# Patient Record
Sex: Female | Born: 1945 | Race: White | Hispanic: No | Marital: Married | Smoking: Never smoker
Health system: Southern US, Community
[De-identification: ages and names within clinical notes are randomized; demographics above are authoritative.]

## PROBLEM LIST (undated history)

## (undated) DIAGNOSIS — E7800 Pure hypercholesterolemia, unspecified: Secondary | ICD-10-CM

## (undated) DIAGNOSIS — T8859XA Other complications of anesthesia, initial encounter: Secondary | ICD-10-CM

## (undated) DIAGNOSIS — K5790 Diverticulosis of intestine, part unspecified, without perforation or abscess without bleeding: Secondary | ICD-10-CM

## (undated) DIAGNOSIS — R112 Nausea with vomiting, unspecified: Secondary | ICD-10-CM

## (undated) DIAGNOSIS — Z9889 Other specified postprocedural states: Secondary | ICD-10-CM

## (undated) DIAGNOSIS — K8020 Calculus of gallbladder without cholecystitis without obstruction: Secondary | ICD-10-CM

## (undated) DIAGNOSIS — R011 Cardiac murmur, unspecified: Secondary | ICD-10-CM

## (undated) DIAGNOSIS — G4730 Sleep apnea, unspecified: Secondary | ICD-10-CM

## (undated) DIAGNOSIS — I10 Essential (primary) hypertension: Secondary | ICD-10-CM

## (undated) DIAGNOSIS — F419 Anxiety disorder, unspecified: Secondary | ICD-10-CM

## (undated) HISTORY — DX: Anxiety disorder, unspecified: F41.9

## (undated) HISTORY — DX: Diverticulosis of intestine, part unspecified, without perforation or abscess without bleeding: K57.90

## (undated) HISTORY — PX: ORIF ANKLE FRACTURE: SHX5408

## (undated) HISTORY — PX: KNEE CARTILAGE SURGERY: SHX688

## (undated) HISTORY — DX: Calculus of gallbladder without cholecystitis without obstruction: K80.20

## (undated) HISTORY — PX: ABDOMINAL HYSTERECTOMY: SHX81

## (undated) HISTORY — PX: COLONOSCOPY: SHX174

## (undated) HISTORY — DX: Cardiac murmur, unspecified: R01.1

---

## 1983-03-21 HISTORY — PX: ABDOMINAL HYSTERECTOMY: SHX81

## 2004-05-09 ENCOUNTER — Encounter: Admission: RE | Admit: 2004-05-09 | Discharge: 2004-05-09 | Payer: Self-pay | Admitting: Neurology

## 2005-03-20 HISTORY — PX: ORIF ANKLE FRACTURE: SHX5408

## 2012-06-14 ENCOUNTER — Other Ambulatory Visit (HOSPITAL_COMMUNITY): Payer: Self-pay | Admitting: Internal Medicine

## 2012-06-14 ENCOUNTER — Ambulatory Visit (HOSPITAL_COMMUNITY)
Admission: RE | Admit: 2012-06-14 | Discharge: 2012-06-14 | Disposition: A | Discharge status: Home / Self Care | Payer: BC Managed Care – PPO | Source: Ambulatory Visit | Attending: Internal Medicine | Admitting: Internal Medicine

## 2012-06-14 DIAGNOSIS — R51 Headache: Secondary | ICD-10-CM | POA: Insufficient documentation

## 2012-06-14 DIAGNOSIS — G8929 Other chronic pain: Secondary | ICD-10-CM

## 2021-06-20 ENCOUNTER — Encounter (HOSPITAL_COMMUNITY): Payer: Self-pay | Admitting: Emergency Medicine

## 2021-06-20 ENCOUNTER — Emergency Department (HOSPITAL_COMMUNITY)
Admission: EM | Admit: 2021-06-20 | Discharge: 2021-06-20 | Disposition: A | Discharge status: Home / Self Care | Payer: Medicare PPO | Attending: Emergency Medicine | Admitting: Emergency Medicine

## 2021-06-20 ENCOUNTER — Emergency Department (HOSPITAL_COMMUNITY): Payer: Medicare PPO

## 2021-06-20 ENCOUNTER — Other Ambulatory Visit: Payer: Self-pay

## 2021-06-20 DIAGNOSIS — K5732 Diverticulitis of large intestine without perforation or abscess without bleeding: Secondary | ICD-10-CM | POA: Insufficient documentation

## 2021-06-20 DIAGNOSIS — K8020 Calculus of gallbladder without cholecystitis without obstruction: Secondary | ICD-10-CM | POA: Insufficient documentation

## 2021-06-20 DIAGNOSIS — K5792 Diverticulitis of intestine, part unspecified, without perforation or abscess without bleeding: Secondary | ICD-10-CM

## 2021-06-20 DIAGNOSIS — R1084 Generalized abdominal pain: Secondary | ICD-10-CM | POA: Diagnosis present

## 2021-06-20 HISTORY — DX: Pure hypercholesterolemia, unspecified: E78.00

## 2021-06-20 HISTORY — DX: Essential (primary) hypertension: I10

## 2021-06-20 LAB — CBC
HCT: 38.2 % (ref 36.0–46.0)
Hemoglobin: 12.2 g/dL (ref 12.0–15.0)
MCH: 29.6 pg (ref 26.0–34.0)
MCHC: 31.9 g/dL (ref 30.0–36.0)
MCV: 92.7 fL (ref 80.0–100.0)
Platelets: 243 10*3/uL (ref 150–400)
RBC: 4.12 MIL/uL (ref 3.87–5.11)
RDW: 13.4 % (ref 11.5–15.5)
WBC: 11.8 10*3/uL — ABNORMAL HIGH (ref 4.0–10.5)
nRBC: 0 % (ref 0.0–0.2)

## 2021-06-20 LAB — COMPREHENSIVE METABOLIC PANEL
ALT: 18 U/L (ref 0–44)
AST: 15 U/L (ref 15–41)
Albumin: 4.1 g/dL (ref 3.5–5.0)
Alkaline Phosphatase: 80 U/L (ref 38–126)
Anion gap: 8 (ref 5–15)
BUN: 15 mg/dL (ref 8–23)
CO2: 27 mmol/L (ref 22–32)
Calcium: 9.5 mg/dL (ref 8.9–10.3)
Chloride: 106 mmol/L (ref 98–111)
Creatinine, Ser: 0.7 mg/dL (ref 0.44–1.00)
GFR, Estimated: >60 mL/min (ref >60)
Glucose, Bld: 101 mg/dL — ABNORMAL HIGH (ref 70–99)
Potassium: 3.7 mmol/L (ref 3.5–5.1)
Sodium: 141 mmol/L (ref 135–145)
Total Bilirubin: 0.5 mg/dL (ref 0.3–1.2)
Total Protein: 7.7 g/dL (ref 6.5–8.1)

## 2021-06-20 LAB — URINALYSIS, ROUTINE W REFLEX MICROSCOPIC
Bacteria, UA: NONE SEEN
Bilirubin Urine: NEGATIVE
Glucose, UA: NEGATIVE mg/dL
Ketones, ur: NEGATIVE mg/dL
Leukocytes,Ua: NEGATIVE
Nitrite: NEGATIVE
Protein, ur: NEGATIVE mg/dL
Specific Gravity, Urine: 1.009 (ref 1.005–1.030)
pH: 6 (ref 5.0–8.0)

## 2021-06-20 LAB — LIPASE, BLOOD: Lipase: 28 U/L (ref 11–51)

## 2021-06-20 MED ORDER — AMOXICILLIN-POT CLAVULANATE 875-125 MG PO TABS: 1.0000 | ORAL_TABLET | Freq: Two times a day (BID) | ORAL | 0 refills | Status: AC

## 2021-06-20 MED ORDER — ONDANSETRON 4 MG PO TBDP: 4.0000 mg | ORAL_TABLET | Freq: Three times a day (TID) | ORAL | 0 refills | Status: DC | PRN

## 2021-06-20 MED ORDER — AMOXICILLIN-POT CLAVULANATE 875-125 MG PO TABS: 1.0000 | ORAL_TABLET | Freq: Once | ORAL | Status: DC

## 2021-06-20 MED ORDER — IOHEXOL 300 MG/ML  SOLN
100.0000 mL | Freq: Once | INTRAMUSCULAR | Status: AC | PRN
  Administered 2021-06-20: 100 mL via INTRAVENOUS

## 2021-06-20 MED ORDER — AMOXICILLIN-POT CLAVULANATE 875-125 MG PO TABS
1.0000 | ORAL_TABLET | Freq: Once | ORAL | Status: AC
  Administered 2021-06-20: 1 via ORAL
  Filled 2021-06-20: qty 1

## 2021-06-20 NOTE — Discharge Instructions (Addendum)
Follow up with your Gi doctor for evaluation. ?

## 2021-06-20 NOTE — ED Triage Notes (Signed)
Pt c/o left lower abd pain x 5 days. Denies n/v/d. Last normal bowel movement this am. Pt referred to ED from [facility] for Abd CT. Pt reports negative UA at [facility].  ?

## 2021-06-21 NOTE — ED Provider Notes (Signed)
?[facility] ?Provider Note ? ? ?CSN: [id number] ?Arrival date & time: 06/20/21  1547 ? ?  ? ?History ? ?Chief Complaint  ?Patient presents with  ? Abdominal Pain  ? ? ?Brittney Smith is a 76 y.o. female. ? ?Patient reports she has a history of diverticulosis she has a family member who has diverticulitis.  She reports she began having some soreness in the left lower quadrant of her abdomen starting about 5 days ago patient reports the pain has increased.  Patient denies fever or chills.  Has not seen any blood in her stool.  Patient also reports that she has had some previous episodes of indigestion and discomfort after eating ? ?The history is provided by the patient. No language interpreter was used.  ?Abdominal Pain ?Pain location:  Generalized ?Pain quality: aching, cramping and sharp   ?Pain radiates to:  Does not radiate ?Pain severity:  Moderate ?Onset quality:  Gradual ?Duration:  5 days ?Timing:  Constant ?Progression:  Worsening ?Chronicity:  New ?Context: not alcohol use   ?Relieved by:  Nothing ?Worsened by:  Nothing ?Ineffective treatments:  None tried ?Associated symptoms: anorexia and nausea   ?Associated symptoms: no fever   ?Risk factors: no alcohol abuse   ? ?  ? ?Home Medications ?Prior to Admission medications   ?Medication Sig Start Date End Date Taking? Authorizing Provider  ?amoxicillin-clavulanate (AUGMENTIN) 875-125 MG tablet Take 1 tablet by mouth 2 (two) times daily for 10 days. 06/20/21 06/30/21 Yes Fransico Meadow, PA-C  ?amoxicillin-clavulanate (AUGMENTIN) 875-125 MG tablet Take 1 tablet by mouth 2 (two) times daily for 10 days. 06/20/21 06/30/21 Yes Fransico Meadow, PA-C  ?ondansetron (ZOFRAN-ODT) 4 MG disintegrating tablet Take 1 tablet (4 mg total) by mouth every 8 (eight) hours as needed for nausea or vomiting. 06/20/21  Yes Fransico Meadow, PA-C  ?   ? ?Allergies    ?Patient has no known allergies.   ? ?Review of Systems   ?Review of Systems  ?Constitutional:  Negative for  fever.  ?Gastrointestinal:  Positive for abdominal pain, anorexia and nausea.  ?All other systems reviewed and are negative. ? ?Physical Exam ?Updated Vital Signs ?BP (!) 146/84   Pulse 78   Temp 98.9 ?F (37.2 ?C)   Resp 20   Ht 5\' 5"  (1.651 m)   Wt 74.8 kg   SpO2 97%   BMI 27.46 kg/m?  ?Physical Exam ?Vitals and nursing note reviewed.  ?Constitutional:   ?   Appearance: She is well-developed.  ?HENT:  ?   Head: Normocephalic.  ?   Mouth/Throat:  ?   Mouth: Mucous membranes are moist.  ?Cardiovascular:  ?   Rate and Rhythm: Normal rate and regular rhythm.  ?Pulmonary:  ?   Effort: Pulmonary effort is normal.  ?Abdominal:  ?   General: Abdomen is flat. There is no distension.  ?   Palpations: Abdomen is soft.  ?   Tenderness: There is abdominal tenderness in the left lower quadrant.  ?Musculoskeletal:     ?   General: Normal range of motion.  ?   Cervical back: Normal range of motion.  ?Skin: ?   General: Skin is warm.  ?Neurological:  ?   Mental Status: She is alert and oriented to person, place, and time.  ? ? ?ED Results / Procedures / Treatments   ?Labs ?(all labs ordered are listed, but only abnormal results are displayed) ?Labs Reviewed  ?COMPREHENSIVE METABOLIC PANEL - Abnormal; Notable for the following  components:  ?    Result Value  ? Glucose, Bld 101 (*)   ? All other components within normal limits  ?CBC - Abnormal; Notable for the following components:  ? WBC 11.8 (*)   ? All other components within normal limits  ?URINALYSIS, ROUTINE W REFLEX MICROSCOPIC - Abnormal; Notable for the following components:  ? Color, Urine STRAW (*)   ? Hgb urine dipstick SMALL (*)   ? All other components within normal limits  ?LIPASE, BLOOD  ? ? ?EKG ?EKG Interpretation ? ?Date/Time:  Monday June 20 2021 16:23:47 EDT ?Ventricular Rate:  80 ?PR Interval:  142 ?QRS Duration: 86 ?QT Interval:  370 ?QTC Calculation: 426 ?R Axis:   61 ?Text Interpretation: Normal sinus rhythm Nonspecific T wave abnormality Abnormal ECG  No previous ECGs available Confirmed by Daleen Bo [phone]) on 06/20/2021 4:33:18 PM ? ?Radiology ?CT ABDOMEN PELVIS W CONTRAST ? ?Result Date: 06/20/2021 ?CLINICAL DATA:  Acute abdominal pain. EXAM: CT ABDOMEN AND PELVIS WITH CONTRAST TECHNIQUE: Multidetector CT imaging of the abdomen and pelvis was performed using the standard protocol following bolus administration of intravenous contrast. RADIATION DOSE REDUCTION: This exam was performed according to the departmental dose-optimization program which includes automated exposure control, adjustment of the mA and/or kV according to patient size and/or use of iterative reconstruction technique. CONTRAST:  177mL OMNIPAQUE IOHEXOL 300 MG/ML  SOLN COMPARISON:  None. FINDINGS: Lower chest: No acute abnormality. Hepatobiliary: Gallstones are present. There is no pericholecystic inflammation. Common bile duct is dilated measuring 11 mm. There is some peripheral calcifications in the liver. There is also a liver cyst measuring 14 mm. Liver is otherwise within normal limits. Pancreas: Unremarkable. No pancreatic ductal dilatation or surrounding inflammatory changes. Spleen: Normal in size without focal abnormality. Adrenals/Urinary Tract: Adrenal glands are unremarkable. Kidneys are normal, without renal calculi, focal lesion, or hydronephrosis. Bladder is unremarkable. Stomach/Bowel: There is diffuse colonic diverticulosis. There is wall thickening and inflammation involving the mid descending colon compatible with acute diverticulitis. There is no evidence for perforation or abscess. There is no bowel obstruction. The appendix is within normal limits. Small bowel and stomach are within normal limits. Vascular/Lymphatic: Aortic atherosclerosis. No enlarged abdominal or pelvic lymph nodes. Reproductive: Status post hysterectomy. No adnexal masses. Other: No abdominal wall hernia or abnormality. No abdominopelvic ascites. Musculoskeletal: No acute osseous findings. There are  lucent lesions with sclerotic borders in the left ischium favored as benign. IMPRESSION: 1. Acute uncomplicated descending colon diverticulitis. 2. Cholelithiasis with common bile duct dilatation. Correlate clinically for cholecystitis. Consider further evaluation with ultrasound. Electronically Signed   By: Ronney Asters M.D.   On: 06/20/2021 20:12   ? ?Procedures ?Procedures  ? ? ?Medications Ordered in ED ?Medications  ?iohexol (OMNIPAQUE) 300 MG/ML solution 100 mL (100 mLs Intravenous Contrast Given 06/20/21 1928)  ?amoxicillin-clavulanate (AUGMENTIN) 875-125 MG per tablet 1 tablet (1 tablet Oral Given 06/20/21 2142)  ? ? ?ED Course/ Medical Decision Making/ A&P ?  ?                        ?Medical Decision Making ?Patient complains of left lower quadrant abdominal pain.  She has a history of reticulosis ? ?Amount and/or Complexity of Data Reviewed ?Independent Historian: spouse ?   Details: Patient's husband is present and supportive.  He reports patient has been complaining of pain for several days ?External Data Reviewed: notes. ?   Details: Care notes are reviewed.  GI notes are reviewed.  Patient  had a colonoscopy in 2021 ?Labs: ordered. Decision-making details documented in ED Course. ?   Details: Laboratory evaluations are ordered reviewed and interpreted patient has a white blood cell count of 11.8 UA is normal and Mistry's are normal ?Radiology: ordered and independent interpretation performed. ?   Details: T scan shows acute uncomplicated descending colon diverticulitis CT exam also shows cholelithiasis with common bile duct dilation. ? ?Risk ?Prescription drug management. ?Risk Details: Patient does not have any right upper quadrant abdominal tenderness.  She has had some previous symptoms which may be secondary to gallbladder disease.  I discussed finding of diverticulitis with patient I will start her on Augmentin patient is also given a prescription for Zofran for nausea patient declined pain medicines  she states that she will take Tylenol.  Patient has seen Dr. Constance Haw with general surgery in the past and she will follow-up with Dr. Constance Haw regarding cholelithiasis.  Patient is advised to follow-up with

## 2021-08-30 ENCOUNTER — Ambulatory Visit: Payer: Self-pay | Admitting: General Surgery

## 2021-08-30 ENCOUNTER — Encounter: Payer: Self-pay | Admitting: General Surgery

## 2021-08-30 ENCOUNTER — Ambulatory Visit: Payer: Medicare PPO | Admitting: General Surgery

## 2021-08-30 VITALS — BP 157/80 | HR 62 | Temp 98.2°F | Resp 14 | Ht 65.0 in | Wt 161.0 lb

## 2021-08-30 DIAGNOSIS — K838 Other specified diseases of biliary tract: Secondary | ICD-10-CM | POA: Diagnosis not present

## 2021-08-30 DIAGNOSIS — K8020 Calculus of gallbladder without cholecystitis without obstruction: Secondary | ICD-10-CM

## 2021-08-30 NOTE — H&P (Signed)
[facility] History and Physical  Reason for Referral: Gallstones  Referring Physician:  Jacalyn Lefevre, MD   Chief Complaint   New Patient (Initial Visit)     Brittney Smith is a 76 y.o. female.  HPI: Brittney Smith is a very sweet 39 yo is the wife of a prior patient who had some abdominal pain from diverticulitis a few months ago but was noted to have gallstones on that CT scan. She reports that for the last 6 months seh has noticed having epigastric pain and nasuea vomiting with greasy meals like spaghetti and lasagna. She is avoiding those types of meals and she has not had the pain. She was noted to have stone and a dilated CBD on the CT and otherwise normal labs. She had to watch her husband go through gallbladder surgery for a gangrenous gallbladder and wants to get this done before it gets worse.   Past Medical History:  Diagnosis Date   High cholesterol    Hypertension     Past Surgical History:  Procedure Laterality Date   ABDOMINAL HYSTERECTOMY     KNEE CARTILAGE SURGERY Left    ORIF ANKLE FRACTURE Left     History reviewed. No pertinent family history.  Social History   Tobacco Use   Smoking status: Never   Smokeless tobacco: Never  Substance Use Topics   Alcohol use: Never   Drug use: Never    Medications: I have reviewed the patient's current medications. Allergies as of 08/30/2021   No Known Allergies      Medication List        Accurate as of August 30, 2021  4:27 PM. If you have any questions, ask your nurse or doctor.          STOP taking these medications    ondansetron 4 MG disintegrating tablet Commonly known as: ZOFRAN-ODT Stopped by: Lucretia Roers, MD       TAKE these medications    Aspirin 81 MG Caps Take by mouth.   atorvastatin 40 MG tablet Commonly known as: LIPITOR atorvastatin 40 mg tablet   escitalopram 10 MG tablet Commonly known as: LEXAPRO Take by mouth.   losartan 100 MG tablet Commonly  known as: COZAAR Take by mouth.   magnesium oxide 400 (240 Mg) MG tablet Commonly known as: MAG-OX Take 400 mg by mouth daily.   VITAMIN D (CHOLECALCIFEROL) PO Take by mouth.         ROS:  A comprehensive review of systems was negative except for: Gastrointestinal: positive for intermittent epigastric pain and nausea/vomiting  Blood pressure (!) 157/80, pulse 62, temperature 98.2 F (36.8 C), temperature source Oral, resp. rate 14, height 5\' 5"  (1.651 m), weight 161 lb (73 kg), SpO2 97 %. Physical Exam Vitals reviewed.  Constitutional:      Appearance: Normal appearance.  HENT:     Head: Normocephalic.     Mouth/Throat:     Mouth: Mucous membranes are moist.  Eyes:     Extraocular Movements: Extraocular movements intact.  Cardiovascular:     Rate and Rhythm: Normal rate and regular rhythm.  Pulmonary:     Effort: Pulmonary effort is normal.     Breath sounds: Normal breath sounds.  Abdominal:     General: There is no distension.     Palpations: Abdomen is soft.     Tenderness: There is no abdominal tenderness.  Musculoskeletal:        General: Normal range of motion.  Cervical back: Normal range of motion.  Skin:    General: Skin is warm.  Neurological:     General: No focal deficit present.     Mental Status: She is alert.  Psychiatric:        Mood and Affect: Mood normal.     Results: personally reviewed- large dilated CBD and gallbladder with stones  CLINICAL DATA:  Acute abdominal pain.   EXAM: CT ABDOMEN AND PELVIS WITH CONTRAST   TECHNIQUE: Multidetector CT imaging of the abdomen and pelvis was performed using the standard protocol following bolus administration of intravenous contrast.   RADIATION DOSE REDUCTION: This exam was performed according to the departmental dose-optimization program which includes automated exposure control, adjustment of the mA and/or kV according to patient size and/or use of iterative reconstruction technique.    CONTRAST:  OMNIPAQUE IOHEXOL 300 MG/ML  SOLN   COMPARISON:  None.   FINDINGS: Lower chest: No acute abnormality.   Hepatobiliary: Gallstones are present. There is no pericholecystic inflammation. Common bile duct is dilated measuring 11 mm. There is some peripheral calcifications in the liver. There is also a liver cyst measuring 14 mm. Liver is otherwise within normal limits.   Pancreas: Unremarkable. No pancreatic ductal dilatation or surrounding inflammatory changes.   Spleen: Normal in size without focal abnormality.   Adrenals/Urinary Tract: Adrenal glands are unremarkable. Kidneys are normal, without renal calculi, focal lesion, or hydronephrosis. Bladder is unremarkable.   Stomach/Bowel: There is diffuse colonic diverticulosis. There is wall thickening and inflammation involving the mid descending colon compatible with acute diverticulitis. There is no evidence for perforation or abscess. There is no bowel obstruction. The appendix is within normal limits. Small bowel and stomach are within normal limits.   Vascular/Lymphatic: Aortic atherosclerosis. No enlarged abdominal or pelvic lymph nodes.   Reproductive: Status post hysterectomy. No adnexal masses.   Other: No abdominal wall hernia or abnormality. No abdominopelvic ascites.   Musculoskeletal: No acute osseous findings. There are lucent lesions with sclerotic borders in the left ischium favored as benign.   IMPRESSION: 1. Acute uncomplicated descending colon diverticulitis. 2. Cholelithiasis with common bile duct dilatation. Correlate clinically for cholecystitis. Consider further evaluation with ultrasound.     Electronically Signed   By: Darliss Cheney M.D.   On: 06/20/2021 20:12  Assessment & Plan:  Brittney Smith is a 76 y.o. female with CBD dilation and stones. I wonder if she has been passing stones and having pain.  -PLAN: I counseled the patient about the indication, risks and benefits of  laparoscopic cholecystectomy.  She understands there is a very small chance for bleeding, infection, injury to normal structures (including common bile duct), conversion to open surgery, persistent symptoms, evolution of postcholecystectomy diarrhea, need for secondary interventions, anesthesia reaction, cardiopulmonary issues and other risks not specifically detailed here. I described the expected recovery, the plan for follow-up and the restrictions during the recovery phase.  All questions were answered. Discussed doing an intraoperative cholangiogram to ensure no lodged stones and risk of allergic reaction or needing ERCP.    All questions were answered to the satisfaction of the patient.     Lucretia Roers 08/30/2021, 4:27 PM

## 2021-08-30 NOTE — Patient Instructions (Signed)
Will plan for laparoscopic cholecystectomy and cholangiogram (looking at the enlarged bile duct).   Minimally Invasive Cholecystectomy Minimally invasive cholecystectomy is surgery to remove the gallbladder. The gallbladder is a pear-shaped organ that lies beneath the liver on the right side of the body. The gallbladder stores bile, which is a fluid that helps the body digest fats. Cholecystectomy is often done to treat inflammation (irritation and swelling) of the gallbladder (cholecystitis). This condition is usually caused by a buildup of gallstones (cholelithiasis) in the gallbladder or when the fluid in the gall bladder becomes stagnant because gallstones get stuck in the ducts (tubes) and block the flow of bile. This can result in inflammation and pain. In severe cases, emergency surgery may be required. This procedure is done through small incisions in the abdomen, instead of one large incision. It is also called laparoscopic surgery. A thin scope with a camera (laparoscope) is inserted through one incision. Then surgical instruments are inserted through the other incisions. In some cases, a minimally invasive surgery may need to be changed to a surgery that is done through a larger incision. This is called open surgery. Tell a health care provider about: Any allergies you have. All medicines you are taking, including vitamins, herbs, eye drops, creams, and over-the-counter medicines. Any problems you or family members have had with anesthetic medicines. Any bleeding problems you have. Any surgeries you have had. Any medical conditions you have. Whether you are pregnant or may be pregnant. What are the risks? Generally, this is a safe procedure. However, problems may occur, including: Infection. Bleeding. Allergic reactions to medicines. Damage to nearby structures or organs. A gallstone remaining in the common bile duct. The common bile duct carries bile from the gallbladder to the small  intestine. A bile leak from the liver or cystic duct after your gallbladder is removed. What happens before the procedure? Medicines Ask your health care provider about: Changing or stopping your regular medicines. This is especially important if you are taking diabetes medicines or blood thinners. Taking medicines such as aspirin and ibuprofen. These medicines can thin your blood. Do not take these medicines unless your health care provider tells you to take them. Taking over-the-counter medicines, vitamins, herbs, and supplements. General instructions If you will be going home right after the procedure, plan to have a responsible adult: Take you home from the hospital or clinic. You will not be allowed to drive. Care for you for the time you are told. Do not use any products that contain nicotine or tobacco for at least 4 weeks before the procedure. These products include cigarettes, chewing tobacco, and vaping devices, such as e-cigarettes. If you need help quitting, ask your health care provider. Ask your health care provider: How your surgery site will be marked. What steps will be taken to help prevent infection. These may include: Removing hair at the surgery site. Washing skin with a germ-killing soap. Taking antibiotic medicine. What happens during the procedure?  An IV will be inserted into one of your veins. You will be given one or both of the following: A medicine to help you relax (sedative). A medicine to make you fall asleep (general anesthetic). Your surgeon will make several small incisions in your abdomen. The laparoscope will be inserted through one of the small incisions. The camera on the laparoscope will send images to a monitor in the operating room. This lets your surgeon see inside your abdomen. A gas will be pumped into your abdomen. This will expand  your abdomen to give the surgeon more room to perform the surgery. Other tools that are needed for the procedure  will be inserted through the other incisions. The gallbladder will be removed through one of the incisions. Your common bile duct may be examined. If stones are found in the common bile duct, they may be removed. After your gallbladder has been removed, the incisions will be closed with stitches (sutures), staples, or skin glue. Your incisions will be covered with a bandage (dressing). The procedure may vary among health care providers and hospitals. What happens after the procedure? Your blood pressure, heart rate, breathing rate, and blood oxygen level will be monitored until you leave the hospital or clinic. You will be given medicines as needed to control your pain. You may have a drain placed in the incision. The drain will be removed a day or two after the procedure. Summary Minimally invasive cholecystectomy, also called laparoscopic cholecystectomy, is surgery to remove the gallbladder using small incisions. Tell your health care provider about all the medical conditions you have and all the medicines you are taking for those conditions. Before the procedure, follow instructions about when to stop eating and drinking and changing or stopping medicines. Plan to have a responsible adult care for you for the time you are told after you leave the hospital or clinic. This information is not intended to replace advice given to you by your health care provider. Make sure you discuss any questions you have with your health care provider.

## 2021-08-30 NOTE — Progress Notes (Signed)
[facility] History and Physical  Reason for Referral: Gallstones  Referring Physician:  Jacalyn Lefevre, MD   Chief Complaint   New Patient (Initial Visit)     Brittney Smith is a 76 y.o. female.  HPI: Brittney Smith is a very sweet 39 yo is the wife of a prior patient who had some abdominal pain from diverticulitis a few months ago but was noted to have gallstones on that CT scan. She reports that for the last 6 months seh has noticed having epigastric pain and nasuea vomiting with greasy meals like spaghetti and lasagna. She is avoiding those types of meals and she has not had the pain. She was noted to have stone and a dilated CBD on the CT and otherwise normal labs. She had to watch her husband go through gallbladder surgery for a gangrenous gallbladder and wants to get this done before it gets worse.   Past Medical History:  Diagnosis Date   High cholesterol    Hypertension     Past Surgical History:  Procedure Laterality Date   ABDOMINAL HYSTERECTOMY     KNEE CARTILAGE SURGERY Left    ORIF ANKLE FRACTURE Left     History reviewed. No pertinent family history.  Social History   Tobacco Use   Smoking status: Never   Smokeless tobacco: Never  Substance Use Topics   Alcohol use: Never   Drug use: Never    Medications: I have reviewed the patient's current medications. Allergies as of 08/30/2021   No Known Allergies      Medication List        Accurate as of August 30, 2021  4:27 PM. If you have any questions, ask your nurse or doctor.          STOP taking these medications    ondansetron 4 MG disintegrating tablet Commonly known as: ZOFRAN-ODT Stopped by: Lucretia Roers, MD       TAKE these medications    Aspirin 81 MG Caps Take by mouth.   atorvastatin 40 MG tablet Commonly known as: LIPITOR atorvastatin 40 mg tablet   escitalopram 10 MG tablet Commonly known as: LEXAPRO Take by mouth.   losartan 100 MG tablet Commonly  known as: COZAAR Take by mouth.   magnesium oxide 400 (240 Mg) MG tablet Commonly known as: MAG-OX Take 400 mg by mouth daily.   VITAMIN D (CHOLECALCIFEROL) PO Take by mouth.         ROS:  A comprehensive review of systems was negative except for: Gastrointestinal: positive for intermittent epigastric pain and nausea/vomiting  Blood pressure (!) 157/80, pulse 62, temperature 98.2 F (36.8 C), temperature source Oral, resp. rate 14, height 5\' 5"  (1.651 m), weight 161 lb (73 kg), SpO2 97 %. Physical Exam Vitals reviewed.  Constitutional:      Appearance: Normal appearance.  HENT:     Head: Normocephalic.     Mouth/Throat:     Mouth: Mucous membranes are moist.  Eyes:     Extraocular Movements: Extraocular movements intact.  Cardiovascular:     Rate and Rhythm: Normal rate and regular rhythm.  Pulmonary:     Effort: Pulmonary effort is normal.     Breath sounds: Normal breath sounds.  Abdominal:     General: There is no distension.     Palpations: Abdomen is soft.     Tenderness: There is no abdominal tenderness.  Musculoskeletal:        General: Normal range of motion.  Cervical back: Normal range of motion.  Skin:    General: Skin is warm.  Neurological:     General: No focal deficit present.     Mental Status: She is alert.  Psychiatric:        Mood and Affect: Mood normal.     Results: personally reviewed- large dilated CBD and gallbladder with stones  CLINICAL DATA:  Acute abdominal pain.   EXAM: CT ABDOMEN AND PELVIS WITH CONTRAST   TECHNIQUE: Multidetector CT imaging of the abdomen and pelvis was performed using the standard protocol following bolus administration of intravenous contrast.   RADIATION DOSE REDUCTION: This exam was performed according to the departmental dose-optimization program which includes automated exposure control, adjustment of the mA and/or kV according to patient size and/or use of iterative reconstruction technique.    CONTRAST:  100mL OMNIPAQUE IOHEXOL 300 MG/ML  SOLN   COMPARISON:  None.   FINDINGS: Lower chest: No acute abnormality.   Hepatobiliary: Gallstones are present. There is no pericholecystic inflammation. Common bile duct is dilated measuring 11 mm. There is some peripheral calcifications in the liver. There is also a liver cyst measuring 14 mm. Liver is otherwise within normal limits.   Pancreas: Unremarkable. No pancreatic ductal dilatation or surrounding inflammatory changes.   Spleen: Normal in size without focal abnormality.   Adrenals/Urinary Tract: Adrenal glands are unremarkable. Kidneys are normal, without renal calculi, focal lesion, or hydronephrosis. Bladder is unremarkable.   Stomach/Bowel: There is diffuse colonic diverticulosis. There is wall thickening and inflammation involving the mid descending colon compatible with acute diverticulitis. There is no evidence for perforation or abscess. There is no bowel obstruction. The appendix is within normal limits. Small bowel and stomach are within normal limits.   Vascular/Lymphatic: Aortic atherosclerosis. No enlarged abdominal or pelvic lymph nodes.   Reproductive: Status post hysterectomy. No adnexal masses.   Other: No abdominal wall hernia or abnormality. No abdominopelvic ascites.   Musculoskeletal: No acute osseous findings. There are lucent lesions with sclerotic borders in the left ischium favored as benign.   IMPRESSION: 1. Acute uncomplicated descending colon diverticulitis. 2. Cholelithiasis with common bile duct dilatation. Correlate clinically for cholecystitis. Consider further evaluation with ultrasound.     Electronically Signed   By: Amy  Guttmann M.D.   On: 06/20/2021 20:12  Assessment & Plan:  Brittney Smith is a 75 y.o. female with CBD dilation and stones. I wonder if she has been passing stones and having pain.  -PLAN: I counseled the patient about the indication, risks and benefits of  laparoscopic cholecystectomy.  She understands there is a very small chance for bleeding, infection, injury to normal structures (including common bile duct), conversion to open surgery, persistent symptoms, evolution of postcholecystectomy diarrhea, need for secondary interventions, anesthesia reaction, cardiopulmonary issues and other risks not specifically detailed here. I described the expected recovery, the plan for follow-up and the restrictions during the recovery phase.  All questions were answered. Discussed doing an intraoperative cholangiogram to ensure no lodged stones and risk of allergic reaction or needing ERCP.    All questions were answered to the satisfaction of the patient.     Estevan Kersh C Puneet Masoner 08/30/2021, 4:27 PM    

## 2021-09-02 NOTE — Patient Instructions (Signed)
Brittney Smith  09/02/2021     @PREFPERIOPPHARMACY @   Your procedure is scheduled on  09/07/2021.   Report to [facility] at  1025  A.M.   Call this number if you have problems the morning of surgery:  [phone]   Remember:  Do not eat or drink after midnight.      Take these medicines the morning of surgery with A SIP OF WATER                                           None     Do not wear jewelry, make-up or nail polish.  Do not wear lotions, powders, or perfumes, or deodorant.  Do not shave 48 hours prior to surgery.  Men may shave face and neck.  Do not bring valuables to the hospital.  [facility] is not [facility] for any belongings or valuables.  Contacts, dentures or bridgework may not be worn into surgery.  Leave your suitcase in the car.  After surgery it may be brought to your room.  For patients admitted to the hospital, discharge time will be determined by your treatment team.  Patients discharged the day of surgery will not be allowed to drive home and must  have someone with them for 24 hours.    Special instructions:   DO NOT smoke tobacco or vape for 24 hours before your procedure.  Please read over the following fact sheets that you were given. Coughing and Deep Breathing, Surgical Site Infection Prevention, Anesthesia Post-op Instructions, and Care and Recovery After Surgery      Minimally Invasive Cholecystectomy, Care After The following information offers guidance on how to care for yourself after your procedure. Your health care provider may also give you more specific instructions. If you have problems or questions, contact your health care provider. What can I expect after the procedure? After the procedure, it is common to have: Pain at your incision sites. You will be given medicines to control this pain. Mild nausea or vomiting. Bloating and possible shoulder pain from the gas that was used during the procedure. Follow these  instructions at home: Medicines Take over-the-counter and prescription medicines only as told by your health care provider. If you were prescribed an antibiotic medicine, take it as told by your health care provider. Do not stop using the antibiotic even if you start to feel better. Ask your health care provider if the medicine prescribed to you: Requires you to avoid driving or using machinery. Can cause constipation. You may need to take these actions to prevent or treat constipation: Drink enough fluid to keep your urine pale yellow. Take over-the-counter or prescription medicines. Eat foods that are high in fiber, such as beans, whole grains, and fresh fruits and vegetables. Limit foods that are high in fat and processed sugars, such as fried or sweet foods. Incision care  Follow instructions from your health care provider about how to take care of your incisions. Make sure you: Wash your hands with soap and water for at least 20 seconds before and after you change your bandage (dressing). If soap and water are not available, use hand sanitizer. Change your dressing as told by your health care provider. Leave stitches (sutures), skin glue, or adhesive strips in place. These skin closures may need to be in place for  2 weeks or longer. If adhesive strip edges start to loosen and curl up, you may trim the loose edges. Do not remove adhesive strips completely unless your health care provider tells you to do that. Do not take baths, swim, or use a hot tub until your health care provider approves. Ask your health care provider if you may take showers. You may only be allowed to take sponge baths. Check your incision area every day for signs of infection. Check for: More redness, swelling, or pain. Fluid or blood. Warmth. Pus or a bad smell. Activity Rest as told by your health care provider. Do not do activities that require a lot of effort. Avoid sitting for a long time without moving. Get up  to take short walks every 1-2 hours. This is important to improve blood flow and breathing. Ask for help if you feel weak or unsteady. Do not lift anything that is heavier than 10 lb (4.5 kg), or the limit that you are told, until your health care provider says that it is safe. Do not play contact sports until your health care provider approves. Do not return to work or school until your health care provider approves. Return to your normal activities as told by your health care provider. Ask your health care provider what activities are safe for you. General instructions If you were given a sedative during the procedure, it can affect you for several hours. Do not drive or operate machinery until your health care provider says that it is safe. Keep all follow-up visits. This is important. Contact a health care provider if: You develop a rash. You have more redness, swelling, or pain around your incisions. You have fluid or blood coming from your incisions. Your incisions feel warm to the touch. You have pus or a bad smell coming from your incisions. You have a fever. One or more of your incisions breaks open. Get help right away if: You have trouble breathing. You have chest pain. You have more pain in your shoulders. You faint or feel dizzy when you stand. You have severe pain in your abdomen. You have nausea or vomiting that lasts for more than one day. You have leg pain that is new or unusual, or if it is localized to one specific spot. These symptoms may represent a serious problem that is an emergency. Do not wait to see if the symptoms will go away. Get medical help right away. Call your local emergency services (911 in the [place] Do not drive yourself to the hospital. Summary After your procedure, it is common to have pain at the incision sites. You may also have nausea or bloating. Follow your health care provider's instructions about medicine, activity restrictions, and caring for  your incision areas. Do not do activities that require a lot of effort. Contact a health care provider if you have a fever or other signs of infection, such as more redness, swelling, or pain around the incisions. Get help right away if you have chest pain, increasing pain in the shoulders, or trouble breathing. This information is not intended to replace advice given to you by your health care provider. Make sure you discuss any questions you have with your health care provider. Document Revised: 09/07/2020 Document Reviewed: 09/07/2020 Elsevier Patient Education  2023 [employer] General Anesthesia, Adult, Care After This sheet gives you information about how to care for yourself after your procedure. Your health care provider may also give you more specific instructions. If you  have problems or questions, contact your health care provider. What can I expect after the procedure? After the procedure, the following side effects are common: Pain or discomfort at the IV site. Nausea. Vomiting. Sore throat. Trouble concentrating. Feeling cold or chills. Feeling weak or tired. Sleepiness and fatigue. Soreness and body aches. These side effects can affect parts of the body that were not involved in surgery. Follow these instructions at home: For the time period you were told by your health care provider:  Rest. Do not participate in activities where you could fall or become injured. Do not drive or use machinery. Do not drink alcohol. Do not take sleeping pills or medicines that cause drowsiness. Do not make important decisions or sign legal documents. Do not take care of children on your own. Eating and drinking Follow any instructions from your health care provider about eating or drinking restrictions. When you feel hungry, start by eating small amounts of foods that are soft and easy to digest (bland), such as toast. Gradually return to your regular diet. Drink enough fluid to keep  your urine pale yellow. If you vomit, rehydrate by drinking water, juice, or clear broth. General instructions If you have sleep apnea, surgery and certain medicines can increase your risk for breathing problems. Follow instructions from your health care provider about wearing your sleep device: Anytime you are sleeping, including during daytime naps. While taking prescription pain medicines, sleeping medicines, or medicines that make you drowsy. Have a responsible adult stay with you for the time you are told. It is important to have someone help care for you until you are awake and alert. Return to your normal activities as told by your health care provider. Ask your health care provider what activities are safe for you. Take over-the-counter and prescription medicines only as told by your health care provider. If you smoke, do not smoke without supervision. Keep all follow-up visits as told by your health care provider. This is important. Contact a health care provider if: You have nausea or vomiting that does not get better with medicine. You cannot eat or drink without vomiting. You have pain that does not get better with medicine. You are unable to pass urine. You develop a skin rash. You have a fever. You have redness around your IV site that gets worse. Get help right away if: You have difficulty breathing. You have chest pain. You have blood in your urine or stool, or you vomit blood. Summary After the procedure, it is common to have a sore throat or nausea. It is also common to feel tired. Have a responsible adult stay with you for the time you are told. It is important to have someone help care for you until you are awake and alert. When you feel hungry, start by eating small amounts of foods that are soft and easy to digest (bland), such as toast. Gradually return to your regular diet. Drink enough fluid to keep your urine pale yellow. Return to your normal activities as told  by your health care provider. Ask your health care provider what activities are safe for you. This information is not intended to replace advice given to you by your health care provider. Make sure you discuss any questions you have with your health care provider. Document Revised: 11/20/2019 Document Reviewed: 06/19/2019 Elsevier Patient Education  2023 [employer] Inc. How to Use Chlorhexidine for Bathing Chlorhexidine gluconate (CHG) is a germ-killing (antiseptic) solution that is used to clean the skin. It  can get rid of the bacteria that normally live on the skin and can keep them away for about 24 hours. To clean your skin with CHG, you may be given: A CHG solution to use in the shower or as part of a sponge bath. A prepackaged cloth that contains CHG. Cleaning your skin with CHG may help lower the risk for infection: While you are staying in the intensive care unit of the hospital. If you have a vascular access, such as a central line, to provide short-term or long-term access to your veins. If you have a catheter to drain urine from your bladder. If you are on a ventilator. A ventilator is a machine that helps you breathe by moving air in and out of your lungs. After surgery. What are the risks? Risks of using CHG include: A skin reaction. Hearing loss, if CHG gets in your ears and you have a perforated eardrum. Eye injury, if CHG gets in your eyes and is not rinsed out. The CHG product catching fire. Make sure that you avoid smoking and flames after applying CHG to your skin. Do not use CHG: If you have a chlorhexidine allergy or have previously reacted to chlorhexidine. On babies younger than 78 months of age. How to use CHG solution Use CHG only as told by your health care provider, and follow the instructions on the label. Use the full amount of CHG as directed. Usually, this is one bottle. During a shower Follow these steps when using CHG solution during a shower (unless your  health care provider gives you different instructions): Start the shower. Use your normal soap and shampoo to wash your face and hair. Turn off the shower or move out of the shower stream. Pour the CHG onto a clean washcloth. Do not use any type of brush or rough-edged sponge. Starting at your neck, lather your body down to your toes. Make sure you follow these instructions: If you will be having surgery, pay special attention to the part of your body where you will be having surgery. Scrub this area for at least 1 minute. Do not use CHG on your head or face. If the solution gets into your ears or eyes, rinse them well with water. Avoid your genital area. Avoid any areas of skin that have broken skin, cuts, or scrapes. Scrub your back and under your arms. Make sure to wash skin folds. Let the lather sit on your skin for 1-2 minutes or as long as told by your health care provider. Thoroughly rinse your entire body in the shower. Make sure that all body creases and crevices are rinsed well. Dry off with a clean towel. Do not put any substances on your body afterward--such as powder, lotion, or perfume--unless you are told to do so by your health care provider. Only use lotions that are recommended by the manufacturer. Put on clean clothes or pajamas. If it is the night before your surgery, sleep in clean sheets.  During a sponge bath Follow these steps when using CHG solution during a sponge bath (unless your health care provider gives you different instructions): Use your normal soap and shampoo to wash your face and hair. Pour the CHG onto a clean washcloth. Starting at your neck, lather your body down to your toes. Make sure you follow these instructions: If you will be having surgery, pay special attention to the part of your body where you will be having surgery. Scrub this area for at least 1  minute. Do not use CHG on your head or face. If the solution gets into your ears or eyes, rinse  them well with water. Avoid your genital area. Avoid any areas of skin that have broken skin, cuts, or scrapes. Scrub your back and under your arms. Make sure to wash skin folds. Let the lather sit on your skin for 1-2 minutes or as long as told by your health care provider. Using a different clean, wet washcloth, thoroughly rinse your entire body. Make sure that all body creases and crevices are rinsed well. Dry off with a clean towel. Do not put any substances on your body afterward--such as powder, lotion, or perfume--unless you are told to do so by your health care provider. Only use lotions that are recommended by the manufacturer. Put on clean clothes or pajamas. If it is the night before your surgery, sleep in clean sheets. How to use CHG prepackaged cloths Only use CHG cloths as told by your health care provider, and follow the instructions on the label. Use the CHG cloth on clean, dry skin. Do not use the CHG cloth on your head or face unless your health care provider tells you to. When washing with the CHG cloth: Avoid your genital area. Avoid any areas of skin that have broken skin, cuts, or scrapes. Before surgery Follow these steps when using a CHG cloth to clean before surgery (unless your health care provider gives you different instructions): Using the CHG cloth, vigorously scrub the part of your body where you will be having surgery. Scrub using a back-and-forth motion for 3 minutes. The area on your body should be completely wet with CHG when you are done scrubbing. Do not rinse. Discard the cloth and let the area air-dry. Do not put any substances on the area afterward, such as powder, lotion, or perfume. Put on clean clothes or pajamas. If it is the night before your surgery, sleep in clean sheets.  For general bathing Follow these steps when using CHG cloths for general bathing (unless your health care provider gives you different instructions). Use a separate CHG cloth  for each area of your body. Make sure you wash between any folds of skin and between your fingers and toes. Wash your body in the following order, switching to a new cloth after each step: The front of your neck, shoulders, and chest. Both of your arms, under your arms, and your hands. Your stomach and groin area, avoiding the genitals. Your right leg and foot. Your left leg and foot. The back of your neck, your back, and your buttocks. Do not rinse. Discard the cloth and let the area air-dry. Do not put any substances on your body afterward--such as powder, lotion, or perfume--unless you are told to do so by your health care provider. Only use lotions that are recommended by the manufacturer. Put on clean clothes or pajamas. Contact a health care provider if: Your skin gets irritated after scrubbing. You have questions about using your solution or cloth. You swallow any chlorhexidine. Call your local poison control center (534-426-5623 in the [place] Get help right away if: Your eyes itch badly, or they become very red or swollen. Your skin itches badly and is red or swollen. Your hearing changes. You have trouble seeing. You have swelling or tingling in your mouth or throat. You have trouble breathing. These symptoms may represent a serious problem that is an emergency. Do not wait to see if the symptoms will go away.  Get medical help right away. Call your local emergency services (911 in the [place] Do not drive yourself to the hospital. Summary Chlorhexidine gluconate (CHG) is a germ-killing (antiseptic) solution that is used to clean the skin. Cleaning your skin with CHG may help to lower your risk for infection. You may be given CHG to use for bathing. It may be in a bottle or in a prepackaged cloth to use on your skin. Carefully follow your health care provider's instructions and the instructions on the product label. Do not use CHG if you have a chlorhexidine allergy. Contact your  health care provider if your skin gets irritated after scrubbing. This information is not intended to replace advice given to you by your health care provider. Make sure you discuss any questions you have with your health care provider. Document Revised: 05/17/2020 Document Reviewed: 05/17/2020 Elsevier Patient Education  2023 [employer].

## 2021-09-05 ENCOUNTER — Encounter (HOSPITAL_COMMUNITY): Payer: Self-pay

## 2021-09-05 ENCOUNTER — Encounter (HOSPITAL_COMMUNITY)
Admission: RE | Admit: 2021-09-05 | Discharge: 2021-09-05 | Disposition: A | Discharge status: Home / Self Care | Payer: Medicare PPO | Source: Ambulatory Visit | Attending: General Surgery | Admitting: General Surgery

## 2021-09-05 DIAGNOSIS — K838 Other specified diseases of biliary tract: Secondary | ICD-10-CM | POA: Insufficient documentation

## 2021-09-05 DIAGNOSIS — Z01812 Encounter for preprocedural laboratory examination: Secondary | ICD-10-CM | POA: Diagnosis not present

## 2021-09-05 DIAGNOSIS — K8020 Calculus of gallbladder without cholecystitis without obstruction: Secondary | ICD-10-CM | POA: Diagnosis not present

## 2021-09-05 HISTORY — DX: Sleep apnea, unspecified: G47.30

## 2021-09-05 HISTORY — DX: Other specified postprocedural states: R11.2

## 2021-09-05 HISTORY — DX: Other complications of anesthesia, initial encounter: T88.59XA

## 2021-09-05 HISTORY — DX: Other specified postprocedural states: Z98.890

## 2021-09-05 LAB — CBC WITH DIFFERENTIAL/PLATELET
Abs Immature Granulocytes: 0.01 10*3/uL (ref 0.00–0.07)
Basophils Absolute: 0 10*3/uL (ref 0.0–0.1)
Basophils Relative: 1 %
Eosinophils Absolute: 0 10*3/uL (ref 0.0–0.5)
Eosinophils Relative: 1 %
HCT: 38.3 % (ref 36.0–46.0)
Hemoglobin: 12.5 g/dL (ref 12.0–15.0)
Immature Granulocytes: 0 %
Lymphocytes Relative: 32 %
Lymphs Abs: 2 10*3/uL (ref 0.7–4.0)
MCH: 30.8 pg (ref 26.0–34.0)
MCHC: 32.6 g/dL (ref 30.0–36.0)
MCV: 94.3 fL (ref 80.0–100.0)
Monocytes Absolute: 0.4 10*3/uL (ref 0.1–1.0)
Monocytes Relative: 6 %
Neutro Abs: 3.7 10*3/uL (ref 1.7–7.7)
Neutrophils Relative %: 60 %
Platelets: 237 10*3/uL (ref 150–400)
RBC: 4.06 MIL/uL (ref 3.87–5.11)
RDW: 13.2 % (ref 11.5–15.5)
WBC: 6.1 10*3/uL (ref 4.0–10.5)
nRBC: 0 % (ref 0.0–0.2)

## 2021-09-05 LAB — COMPREHENSIVE METABOLIC PANEL
ALT: 18 U/L (ref 0–44)
AST: 17 U/L (ref 15–41)
Albumin: 4.2 g/dL (ref 3.5–5.0)
Alkaline Phosphatase: 88 U/L (ref 38–126)
Anion gap: 5 (ref 5–15)
BUN: 12 mg/dL (ref 8–23)
CO2: 28 mmol/L (ref 22–32)
Calcium: 9.7 mg/dL (ref 8.9–10.3)
Chloride: 108 mmol/L (ref 98–111)
Creatinine, Ser: 0.76 mg/dL (ref 0.44–1.00)
GFR, Estimated: >60 mL/min (ref >60)
Glucose, Bld: 122 mg/dL — ABNORMAL HIGH (ref 70–99)
Potassium: 4.4 mmol/L (ref 3.5–5.1)
Sodium: 141 mmol/L (ref 135–145)
Total Bilirubin: 0.5 mg/dL (ref 0.3–1.2)
Total Protein: 7.6 g/dL (ref 6.5–8.1)

## 2021-09-07 ENCOUNTER — Encounter (HOSPITAL_COMMUNITY)
Admission: RE | Disposition: A | Discharge status: Home / Self Care | Payer: Self-pay | Source: Ambulatory Visit | Attending: General Surgery

## 2021-09-07 ENCOUNTER — Ambulatory Visit (HOSPITAL_BASED_OUTPATIENT_CLINIC_OR_DEPARTMENT_OTHER): Payer: Medicare PPO | Admitting: Anesthesiology

## 2021-09-07 ENCOUNTER — Ambulatory Visit (HOSPITAL_COMMUNITY)
Admission: RE | Admit: 2021-09-07 | Discharge: 2021-09-07 | Disposition: A | Discharge status: Home / Self Care | Payer: Medicare PPO | Source: Ambulatory Visit | Attending: General Surgery | Admitting: General Surgery

## 2021-09-07 ENCOUNTER — Encounter (HOSPITAL_COMMUNITY): Payer: Self-pay | Admitting: General Surgery

## 2021-09-07 ENCOUNTER — Ambulatory Visit (HOSPITAL_COMMUNITY): Payer: Medicare PPO

## 2021-09-07 ENCOUNTER — Ambulatory Visit (HOSPITAL_COMMUNITY): Payer: Medicare PPO | Admitting: Anesthesiology

## 2021-09-07 DIAGNOSIS — K8010 Calculus of gallbladder with chronic cholecystitis without obstruction: Secondary | ICD-10-CM | POA: Diagnosis not present

## 2021-09-07 DIAGNOSIS — G4733 Obstructive sleep apnea (adult) (pediatric): Secondary | ICD-10-CM

## 2021-09-07 DIAGNOSIS — Z79899 Other long term (current) drug therapy: Secondary | ICD-10-CM | POA: Diagnosis not present

## 2021-09-07 DIAGNOSIS — G4730 Sleep apnea, unspecified: Secondary | ICD-10-CM | POA: Insufficient documentation

## 2021-09-07 DIAGNOSIS — I10 Essential (primary) hypertension: Secondary | ICD-10-CM | POA: Diagnosis not present

## 2021-09-07 DIAGNOSIS — K838 Other specified diseases of biliary tract: Secondary | ICD-10-CM | POA: Diagnosis not present

## 2021-09-07 DIAGNOSIS — K8020 Calculus of gallbladder without cholecystitis without obstruction: Secondary | ICD-10-CM

## 2021-09-07 HISTORY — PX: CHOLECYSTECTOMY: SHX55

## 2021-09-07 SURGERY — LAPAROSCOPIC CHOLECYSTECTOMY WITH INTRAOPERATIVE CHOLANGIOGRAM
Anesthesia: General

## 2021-09-07 MED ORDER — PROPOFOL 10 MG/ML IV BOLUS
INTRAVENOUS | Status: AC
  Filled 2021-09-07: qty 20

## 2021-09-07 MED ORDER — ROCURONIUM BROMIDE 10 MG/ML (PF) SYRINGE
PREFILLED_SYRINGE | INTRAVENOUS | Status: AC
  Filled 2021-09-07: qty 10

## 2021-09-07 MED ORDER — KETOROLAC TROMETHAMINE 15 MG/ML IJ SOLN
INTRAMUSCULAR | Status: AC
  Filled 2021-09-07: qty 1

## 2021-09-07 MED ORDER — LIDOCAINE HCL (PF) 2 % IJ SOLN
INTRAMUSCULAR | Status: AC
  Filled 2021-09-07: qty 5

## 2021-09-07 MED ORDER — CHLORHEXIDINE GLUCONATE CLOTH 2 % EX PADS: 6.0000 | MEDICATED_PAD | Freq: Once | CUTANEOUS | Status: DC

## 2021-09-07 MED ORDER — DEXAMETHASONE SODIUM PHOSPHATE 4 MG/ML IJ SOLN
INTRAMUSCULAR | Status: DC | PRN
  Administered 2021-09-07: 4 mg via INTRAVENOUS

## 2021-09-07 MED ORDER — PROPOFOL 10 MG/ML IV BOLUS
INTRAVENOUS | Status: DC | PRN
  Administered 2021-09-07: 170 mg via INTRAVENOUS

## 2021-09-07 MED ORDER — EPHEDRINE 5 MG/ML INJ
INTRAVENOUS | Status: AC
  Filled 2021-09-07: qty 5

## 2021-09-07 MED ORDER — CHLORHEXIDINE GLUCONATE 0.12 % MT SOLN
15.0000 mL | Freq: Once | OROMUCOSAL | Status: AC
  Administered 2021-09-07: 15 mL via OROMUCOSAL

## 2021-09-07 MED ORDER — EPHEDRINE SULFATE-NACL 50-0.9 MG/10ML-% IV SOSY
PREFILLED_SYRINGE | INTRAVENOUS | Status: DC | PRN
  Administered 2021-09-07: 5 mg via INTRAVENOUS
  Administered 2021-09-07: 10 mg via INTRAVENOUS

## 2021-09-07 MED ORDER — ONDANSETRON HCL 4 MG/2ML IJ SOLN
INTRAMUSCULAR | Status: AC
  Filled 2021-09-07: qty 2

## 2021-09-07 MED ORDER — ONDANSETRON HCL 4 MG/2ML IJ SOLN
INTRAMUSCULAR | Status: DC | PRN
  Administered 2021-09-07: 4 mg via INTRAVENOUS

## 2021-09-07 MED ORDER — LACTATED RINGERS IV SOLN: INTRAVENOUS | Status: DC

## 2021-09-07 MED ORDER — CHLORHEXIDINE GLUCONATE CLOTH 2 % EX PADS
6.0000 | MEDICATED_PAD | Freq: Once | CUTANEOUS | Status: AC
  Administered 2021-09-07: 6 via TOPICAL

## 2021-09-07 MED ORDER — CHLORHEXIDINE GLUCONATE 0.12 % MT SOLN
OROMUCOSAL | Status: AC
  Filled 2021-09-07: qty 15

## 2021-09-07 MED ORDER — KETOROLAC TROMETHAMINE 30 MG/ML IJ SOLN
INTRAMUSCULAR | Status: AC
  Filled 2021-09-07: qty 1

## 2021-09-07 MED ORDER — HEMOSTATIC AGENTS (NO CHARGE) OPTIME
TOPICAL | Status: DC | PRN
  Administered 2021-09-07: 1 via TOPICAL

## 2021-09-07 MED ORDER — LIDOCAINE HCL (CARDIAC) PF 100 MG/5ML IV SOSY
PREFILLED_SYRINGE | INTRAVENOUS | Status: DC | PRN
  Administered 2021-09-07: 60 mg via INTRAVENOUS

## 2021-09-07 MED ORDER — SODIUM CHLORIDE 0.9 % IV SOLN
INTRAVENOUS | Status: AC
  Filled 2021-09-07: qty 2

## 2021-09-07 MED ORDER — SODIUM CHLORIDE 0.9 % IV SOLN
INTRAVENOUS | Status: DC | PRN
  Administered 2021-09-07: 500 mL via INTRAMUSCULAR

## 2021-09-07 MED ORDER — HYDROMORPHONE HCL 1 MG/ML IJ SOLN: 0.2500 mg | INTRAMUSCULAR | Status: DC | PRN

## 2021-09-07 MED ORDER — FENTANYL CITRATE (PF) 100 MCG/2ML IJ SOLN
INTRAMUSCULAR | Status: AC
  Filled 2021-09-07: qty 2

## 2021-09-07 MED ORDER — BUPIVACAINE HCL (PF) 0.5 % IJ SOLN
INTRAMUSCULAR | Status: DC | PRN
  Administered 2021-09-07: 20 mL

## 2021-09-07 MED ORDER — BUPIVACAINE HCL (PF) 0.5 % IJ SOLN
INTRAMUSCULAR | Status: AC
  Filled 2021-09-07: qty 30

## 2021-09-07 MED ORDER — SODIUM CHLORIDE 0.9 % IV SOLN
2.0000 g | INTRAVENOUS | Status: AC
  Administered 2021-09-07: 2 g via INTRAVENOUS

## 2021-09-07 MED ORDER — ORAL CARE MOUTH RINSE: 15.0000 mL | Freq: Once | OROMUCOSAL | Status: AC

## 2021-09-07 MED ORDER — KETOROLAC TROMETHAMINE 15 MG/ML IJ SOLN
15.0000 mg | Freq: Once | INTRAMUSCULAR | Status: AC
Start: 2021-09-07 — End: 2021-09-07
  Administered 2021-09-07: 15 mg via INTRAVENOUS

## 2021-09-07 MED ORDER — SUGAMMADEX SODIUM 200 MG/2ML IV SOLN
INTRAVENOUS | Status: DC | PRN
  Administered 2021-09-07: 200 mg via INTRAVENOUS

## 2021-09-07 MED ORDER — DEXAMETHASONE SODIUM PHOSPHATE 10 MG/ML IJ SOLN
INTRAMUSCULAR | Status: AC
  Filled 2021-09-07: qty 1

## 2021-09-07 MED ORDER — ONDANSETRON HCL 4 MG PO TABS: 4.0000 mg | ORAL_TABLET | Freq: Three times a day (TID) | ORAL | 1 refills | Status: AC | PRN

## 2021-09-07 MED ORDER — FENTANYL CITRATE (PF) 100 MCG/2ML IJ SOLN
INTRAMUSCULAR | Status: DC | PRN
  Administered 2021-09-07 (×2): 50 ug via INTRAVENOUS

## 2021-09-07 MED ORDER — OXYCODONE HCL 5 MG PO TABS: 5.0000 mg | ORAL_TABLET | ORAL | 0 refills | Status: AC | PRN

## 2021-09-07 MED ORDER — ROCURONIUM BROMIDE 10 MG/ML (PF) SYRINGE
PREFILLED_SYRINGE | INTRAVENOUS | Status: DC | PRN
  Administered 2021-09-07: 50 mg via INTRAVENOUS

## 2021-09-07 MED ORDER — ONDANSETRON HCL 4 MG/2ML IJ SOLN
4.0000 mg | Freq: Once | INTRAMUSCULAR | Status: AC | PRN
  Administered 2021-09-07: 4 mg via INTRAVENOUS
  Filled 2021-09-07: qty 2

## 2021-09-07 SURGICAL SUPPLY — 50 items
ADH SKN CLS APL DERMABOND .7 (GAUZE/BANDAGES/DRESSINGS) ×1
APL PRP STRL LF DISP 70% ISPRP (MISCELLANEOUS) ×1
APPLIER CLIP ROT 10 11.4 M/L (STAPLE) ×2
APR CLP MED LRG 11.4X10 (STAPLE) ×1
BAG DECANTER FOR FLEXI CONT (MISCELLANEOUS) ×2 IMPLANT
BLADE SURG 15 STRL LF DISP TIS (BLADE) ×1 IMPLANT
BLADE SURG 15 STRL SS (BLADE) ×2
CATH CHOLANGIOGRAM 4.5FR (CATHETERS) ×1 IMPLANT
CHLORAPREP W/TINT 26 (MISCELLANEOUS) ×2 IMPLANT
CLIP APPLIE ROT 10 11.4 M/L (STAPLE) ×1 IMPLANT
CLOTH BEACON ORANGE TIMEOUT ST (SAFETY) ×2 IMPLANT
COVER LIGHT HANDLE STERIS (MISCELLANEOUS) ×4 IMPLANT
DECANTER SPIKE VIAL GLASS SM (MISCELLANEOUS) ×4 IMPLANT
DERMABOND ADVANCED (GAUZE/BANDAGES/DRESSINGS) ×1
DERMABOND ADVANCED .7 DNX12 (GAUZE/BANDAGES/DRESSINGS) ×1 IMPLANT
DRAPE C-ARM FOLDED MOBILE STRL (DRAPES) ×2 IMPLANT
ELECT REM PT RETURN 9FT ADLT (ELECTROSURGICAL) ×2
ELECTRODE REM PT RTRN 9FT ADLT (ELECTROSURGICAL) ×1 IMPLANT
GLOVE BIO SURGEON STRL SZ 6.5 (GLOVE) ×3 IMPLANT
GLOVE BIOGEL PI IND STRL 6.5 (GLOVE) ×1 IMPLANT
GLOVE BIOGEL PI IND STRL 7.0 (GLOVE) ×2 IMPLANT
GLOVE BIOGEL PI INDICATOR 6.5 (GLOVE) ×3
GLOVE BIOGEL PI INDICATOR 7.0 (GLOVE) ×2
GOWN STRL REUS W/TWL LRG LVL3 (GOWN DISPOSABLE) ×6 IMPLANT
HEMOSTAT SNOW SURGICEL 2X4 (HEMOSTASIS) ×2 IMPLANT
INST SET LAPROSCOPIC AP (KITS) ×2 IMPLANT
IV NS 500ML (IV SOLUTION) ×2
IV NS 500ML BAXH (IV SOLUTION) ×1 IMPLANT
KIT TURNOVER KIT A (KITS) ×2 IMPLANT
MANIFOLD NEPTUNE II (INSTRUMENTS) ×2 IMPLANT
NEEDLE INSUFFLATION 120MM (ENDOMECHANICALS) ×2 IMPLANT
NS IRRIG 1000ML POUR BTL (IV SOLUTION) ×2 IMPLANT
PACK LAP CHOLE LZT030E (CUSTOM PROCEDURE TRAY) ×2 IMPLANT
PAD ARMBOARD 7.5X6 YLW CONV (MISCELLANEOUS) ×2 IMPLANT
SET BASIN LINEN APH (SET/KITS/TRAYS/PACK) ×2 IMPLANT
SET TUBE IRRIG SUCTION NO TIP (IRRIGATION / IRRIGATOR) IMPLANT
SET TUBE SMOKE EVAC HIGH FLOW (TUBING) ×2 IMPLANT
SLEEVE Z-THREAD 5X100MM (TROCAR) ×2 IMPLANT
SUT MNCRL AB 4-0 PS2 18 (SUTURE) ×4 IMPLANT
SUT VICRYL 0 UR6 27IN ABS (SUTURE) ×2 IMPLANT
SYR 20ML LL LF (SYRINGE) ×2 IMPLANT
SYR 30ML LL (SYRINGE) ×2 IMPLANT
SYR CONTROL 10ML LL (SYRINGE) ×2 IMPLANT
SYS BAG RETRIEVAL 10MM (BASKET) ×2
SYSTEM BAG RETRIEVAL 10MM (BASKET) ×1 IMPLANT
TROCAR Z-THRD FIOS HNDL 11X100 (TROCAR) ×2 IMPLANT
TROCAR Z-THREAD FIOS 5X100MM (TROCAR) ×2 IMPLANT
TROCAR Z-THREAD SLEEVE 11X100 (TROCAR) ×2 IMPLANT
TUBE CONNECTING 12X1/4 (SUCTIONS) ×2 IMPLANT
WARMER LAPAROSCOPE (MISCELLANEOUS) ×2 IMPLANT

## 2021-09-07 NOTE — Interval H&P Note (Signed)
History and Physical Interval Note:  09/07/2021 10:36 AM  Brittney Smith  has presented today for surgery, with the diagnosis of CHOLELITHIASIS BILIARY DILATION.  The various methods of treatment have been discussed with the patient and family. After consideration of risks, benefits and other options for treatment, the patient has consented to  Procedure(s): LAPAROSCOPIC CHOLECYSTECTOMY WITH INTRAOPERATIVE CHOLANGIOGRAM (N/A) as a surgical intervention.  The patient's history has been reviewed, patient examined, no change in status, stable for surgery.  I have reviewed the patient's chart and labs.  Questions were answered to the patient's satisfaction.     Lucretia Roers

## 2021-09-07 NOTE — Anesthesia Preprocedure Evaluation (Addendum)
Anesthesia Evaluation  Patient identified by MRN, date of birth, ID band Patient awake    Reviewed: Allergy & Precautions, NPO status , Patient's Chart, lab work & pertinent test results  History of Anesthesia Complications (+) PONV, DIFFICULT AIRWAY and history of anesthetic complications  Airway Mallampati: II  TM Distance: >3 FB Neck ROM: Full    Dental  (+) Dental Advisory Given, Teeth Intact   Pulmonary sleep apnea and Continuous Positive Airway Pressure Ventilation ,    Pulmonary exam normal breath sounds clear to auscultation       Cardiovascular Exercise Tolerance: Good hypertension, Pt. on medications  Rhythm:Regular Rate:Normal + Systolic murmurs    Neuro/Psych negative neurological ROS  negative psych ROS   GI/Hepatic negative GI ROS, Neg liver ROS,   Endo/Other  negative endocrine ROS  Renal/GU negative Renal ROS  negative genitourinary   Musculoskeletal negative musculoskeletal ROS (+)   Abdominal   Peds negative pediatric ROS (+)  Hematology negative hematology ROS (+)   Anesthesia Other Findings   Reproductive/Obstetrics negative OB ROS                            Anesthesia Physical Anesthesia Plan  ASA: 3  Anesthesia Plan: General   Post-op Pain Management: Dilaudid IV   Induction: Intravenous  PONV Risk Score and Plan: 4 or greater and Ondansetron and Dexamethasone  Airway Management Planned: Oral ETT and Video Laryngoscope Planned  Additional Equipment:   Intra-op Plan:   Post-operative Plan: Extubation in OR  Informed Consent: I have reviewed the patients History and Physical, chart, labs and discussed the procedure including the risks, benefits and alternatives for the proposed anesthesia with the patient or authorized representative who has indicated his/her understanding and acceptance.     Dental advisory given  Plan Discussed with: CRNA and  Surgeon  Anesthesia Plan Comments: (Will use glidescope and ETT 6.5.)        Anesthesia Quick Evaluation

## 2021-09-07 NOTE — Transfer of Care (Signed)
Immediate Anesthesia Transfer of Care Note  Patient: Brittney Smith  Procedure(s) Performed: LAPAROSCOPIC CHOLECYSTECTOMY WITH INTRAOPERATIVE CHOLANGIOGRAM  Patient Location: PACU  Anesthesia Type:General  Level of Consciousness: drowsy  Airway & Oxygen Therapy: Patient Spontanous Breathing and Patient connected to nasal cannula oxygen  Post-op Assessment: Report given to RN and Post -op Vital signs reviewed and stable  Post vital signs: Reviewed and stable  Last Vitals:  Vitals Value Taken Time  BP 154/69 09/07/21 1248  Temp    Pulse 88 09/07/21 1250  Resp 10 09/07/21 1250  SpO2 95 % 09/07/21 1250  Vitals shown include unvalidated device data.  Last Pain:  Vitals:   09/07/21 1043  TempSrc: Oral  PainSc: 0-No pain         Complications:  Encounter Notable Events  Notable Event Outcome Phase Comment  Difficult to intubate - expected  Intraprocedure Filed from anesthesia note documentation.

## 2021-09-07 NOTE — Discharge Instructions (Signed)
Discharge Laparoscopic Surgery Instructions:  Common Complaints: Right shoulder pain is common after laparoscopic surgery. This is secondary to the gas used in the surgery being trapped under the diaphragm.  Walk to help your body absorb the gas. This will improve in a few days. Pain at the port sites are common, especially the larger port sites. This will improve with time.  Some nausea is common and poor appetite. The main goal is to stay hydrated the first few days after surgery.   Diet/ Activity: Diet as tolerated. You may not have an appetite, but it is important to stay hydrated. Drink 64 ounces of water a day. Your appetite will return with time.  Shower per your regular routine daily.  Do not take hot showers. Take warm showers that are less than 10 minutes. Rest and listen to your body, but do not remain in bed all day.  Walk everyday for at least 15-20 minutes. Deep cough and move around every 1-2 hours in the first few days after surgery.  Do not lift > 10 lbs, perform excessive bending, pushing, pulling, squatting for 1-2 weeks after surgery.  Do not pick at the dermabond glue on your incision sites.  This glue film will remain in place for 1-2 weeks and will start to peel off.  Do not place lotions or balms on your incision unless instructed to specifically by Dr. Romolo Sieling.   Pain Expectations and Narcotics: -After surgery you will have pain associated with your incisions and this is normal. The pain is muscular and nerve pain, and will get better with time. -You are encouraged and expected to take non narcotic medications like tylenol and ibuprofen (when able) to treat pain as multiple modalities can aid with pain treatment. -Narcotics are only used when pain is severe or there is breakthrough pain. -You are not expected to have a pain score of 0 after surgery, as we cannot prevent pain. A pain score of 3-4 that allows you to be functional, move, walk, and tolerate some activity is  the goal. The pain will continue to improve over the days after surgery and is dependent on your surgery. -Due to  law, we are only able to give a certain amount of pain medication to treat post operative pain, and we only give additional narcotics on a patient by patient basis.  -For most laparoscopic surgery, studies have shown that the majority of patients only need 10-15 narcotic pills, and for open surgeries most patients only need 15-20.   -Having appropriate expectations of pain and knowledge of pain management with non narcotics is important as we do not want anyone to become addicted to narcotic pain medication.  -Using ice packs in the first 48 hours and heating pads after 48 hours, wearing an abdominal binder (when recommended), and using over the counter medications are all ways to help with pain management.   -Simple acts like meditation and mindfulness practices after surgery can also help with pain control and research has proven the benefit of these practices.  Medication: Take tylenol and ibuprofen as needed for pain control, alternating every 4-6 hours.  Example:  Tylenol 1000mg @ 6am, 12noon, 6pm, 12midnight (Do not exceed 4000mg of tylenol a day). Ibuprofen 800mg @ 9am, 3pm, 9pm, 3am (Do not exceed 3600mg of ibuprofen a day).  Take Roxicodone for breakthrough pain every 4 hours.  Take Colace for constipation related to narcotic pain medication. If you do not have a bowel movement in 2 days, take Miralax   over the counter.  Drink plenty of water to also prevent constipation.   Contact Information: If you have questions or concerns, please call our office, [phone], Monday- Thursday 8AM-5PM and Friday 8AM-12Noon.  If it is after hours or on the weekend, please call [facility], [phone], [phone], and ask to speak to the surgeon on call for Dr. Philopater Mucha at [place].   

## 2021-09-07 NOTE — Progress Notes (Signed)
[facility]  Updated husband.   Rx to pharmacy. Will call in a few weeks. If needs  to be seen in person, she can call the office.  Algis Greenhouse, MD [facility] [address] [place], [place] [address] [phone] (office)

## 2021-09-07 NOTE — Op Note (Signed)
Operative Note   Preoperative Diagnosis: Symptomatic cholelithiasis, dilated common bile duct    Postoperative Diagnosis: Same   Procedure(s) Performed: Laparoscopic cholecystectomy and intraoperative cholangiogram   Surgeon: Leatrice Jewels. Henreitta Leber, MD   Assistants: No qualified resident was available   Anesthesia: General endotracheal   Anesthesiologist: Molli Barrows, MD    Specimens: Gallbladder    Estimated Blood Loss: Minimal    Blood Replacement: None    Complications: None    Operative Findings:  Large distended gallbladder; long cystic duct, dilated common duct on cholangiogram   Procedure: The patient was taken to the operating room and placed supine. General endotracheal anesthesia was induced. Intravenous antibiotics were  administered per protocol. An orogastric tube positioned to decompress the stomach. The abdomen was prepared and draped in the usual sterile fashion.    A supraumbilical incision was made and a Veress technique was utilized to achieve pneumoperitoneum to 15 mmHg with carbon dioxide. A 11 mm optiview port was placed through the supraumbilical region, and a 10 mm 0-degree operative laparoscope was introduced. The area underlying the trocar and Veress needle were inspected and without evidence of injury.  Remaining trocars were placed under direct vision. Two 5 mm ports were placed in the right abdomen, between the anterior axillary and midclavicular line.  A final 11 mm port was placed through the mid-epigastrium, near the falciform ligament.    The gallbladder fundus was elevated cephalad and the infundibulum was retracted to the patient's right. The gallbladder/cystic duct junction was skeletonized. The cystic artery noted in the triangle of Calot and was also skeletonized.  We then continued liberal medial and lateral dissection until the critical view of safety was achieved.    The cystic artery was double clipped and divided.  The cystic duct was  clipped distally and a ductotomy was made. The cholangiogram catheter and clamp were placed and saline flushed. The cholangiogram was performed and there was a dilated common duct but no obvious stones or filling defects. The intrahepatic ducts were visualized and without filling defects.  The duct was then flushed with saline again. The proximal duct was triply clipped and the duct was fully divided. The gallbladder was then dissected from the liver bed with electrocautery. The specimen was placed in an Endopouch and was retrieved through the epigastric site.   Final inspection revealed acceptable hemostasis. Surgical SNOW was placed in the gallbladder bed.  Trocars were removed and pneumoperitoneum was released.  0 Vicryl fascial sutures were used to close the epigastric and umbilical port sites. Skin incisions were closed with 4-0 Monocryl subcuticular sutures and Dermabond. The patient was awakened from anesthesia and extubated without complication.    Algis Greenhouse, MD [facility] [address] [place] [place], [place] [address] [phone] (office)

## 2021-09-07 NOTE — Anesthesia Postprocedure Evaluation (Signed)
Anesthesia Post Note  Patient: Brittney Smith  Procedure(s) Performed: LAPAROSCOPIC CHOLECYSTECTOMY WITH INTRAOPERATIVE CHOLANGIOGRAM  Patient location during evaluation: Phase II Anesthesia Type: General Level of consciousness: awake and alert and oriented Pain management: pain level controlled Vital Signs Assessment: post-procedure vital signs reviewed and stable Respiratory status: spontaneous breathing, nonlabored ventilation and respiratory function stable Cardiovascular status: blood pressure returned to baseline and stable Postop Assessment: no apparent nausea or vomiting Anesthetic complications: yes   Encounter Notable Events  Notable Event Outcome Phase Comment  Difficult to intubate - expected  Intraprocedure Filed from anesthesia note documentation.     Last Vitals:  Vitals:   09/07/21 1345 09/07/21 1355  BP:  139/71  Pulse:  75  Resp:  16  Temp:  36.7 C  SpO2: 91% 96%    Last Pain:  Vitals:   09/07/21 1355  TempSrc: Oral  PainSc: 4                  Pal Shell C Brittney Smith

## 2021-09-07 NOTE — Anesthesia Procedure Notes (Signed)
Procedure Name: Intubation Date/Time: 09/07/2021 11:24 AM  Performed by: Caren Macadam, CRNAPre-anesthesia Checklist: Patient identified, Emergency Drugs available, Suction available and Patient being monitored Patient Re-evaluated:Patient Re-evaluated prior to induction Oxygen Delivery Method: Circle system utilized Preoxygenation: Pre-oxygenation with 100% oxygen Induction Type: IV induction Ventilation: Mask ventilation without difficulty Laryngoscope Size: Glidescope Grade View: Grade I Tube type: Oral Tube size: 6.5 mm Number of attempts: 1 Airway Equipment and Method: Stylet Placement Confirmation: ETT inserted through vocal cords under direct vision, positive ETCO2 and breath sounds checked- equal and bilateral Secured at: 19 cm Tube secured with: Tape Dental Injury: Teeth and Oropharynx as per pre-operative assessment  Difficulty Due To: Difficulty was anticipated

## 2021-09-07 NOTE — Progress Notes (Signed)
[facility]  Cholangiogram report reviewed and I agree. No filling defect. We did have some leakage of contrast from the catheter and this is what is being seen on the report. No concern intraoperative for any sort of biliary leak.  Algis Greenhouse MD

## 2021-09-08 LAB — SURGICAL PATHOLOGY

## 2021-09-12 ENCOUNTER — Encounter (HOSPITAL_COMMUNITY): Payer: Self-pay | Admitting: General Surgery

## 2021-09-27 ENCOUNTER — Ambulatory Visit (INDEPENDENT_AMBULATORY_CARE_PROVIDER_SITE_OTHER): Payer: Medicare PPO | Admitting: General Surgery

## 2021-09-27 DIAGNOSIS — K838 Other specified diseases of biliary tract: Secondary | ICD-10-CM

## 2021-09-27 DIAGNOSIS — K8020 Calculus of gallbladder without cholecystitis without obstruction: Secondary | ICD-10-CM

## 2021-09-27 NOTE — Progress Notes (Signed)
[facility]  I am calling the patient for post operative evaluation. This is not a billable encounter as it is under the global charges for the surgery.  The patient had a laparoscopic cholecystectomy and cholangiogram on 09/07/21.  Cholangiogram was normal and no concern for any leak (see comment from radiology in impression). The patient reports that she is doing well. The are tolerating a diet, having good pain control, and having regular Bms and off stool softeners now.  The incisions are healing. The patient has no concerns.   Pathology: FINAL MICROSCOPIC DIAGNOSIS:   A. GALLBLADDER, CHOLECYSTECTOMY:  Chronic cholecystitis.  Cholelithiasis.   Will see the patient PRN.   Algis Greenhouse, MD [facility] [address] [place], [place] [address] [phone] (office)

## 2023-07-27 ENCOUNTER — Encounter: Payer: Self-pay | Admitting: Gastroenterology

## 2023-08-30 ENCOUNTER — Ambulatory Visit: Payer: Self-pay | Admitting: Gastroenterology

## 2023-08-30 ENCOUNTER — Encounter: Payer: Self-pay | Admitting: Gastroenterology

## 2023-08-30 ENCOUNTER — Ambulatory Visit: Admitting: Gastroenterology

## 2023-08-30 ENCOUNTER — Other Ambulatory Visit

## 2023-08-30 VITALS — BP 138/70 | HR 61 | Ht 65.0 in | Wt 164.0 lb

## 2023-08-30 DIAGNOSIS — R195 Other fecal abnormalities: Secondary | ICD-10-CM

## 2023-08-30 DIAGNOSIS — Z800 Family history of malignant neoplasm of digestive organs: Secondary | ICD-10-CM

## 2023-08-30 DIAGNOSIS — Z83719 Family history of colon polyps, unspecified: Secondary | ICD-10-CM

## 2023-08-30 DIAGNOSIS — K625 Hemorrhage of anus and rectum: Secondary | ICD-10-CM | POA: Diagnosis not present

## 2023-08-30 DIAGNOSIS — K5900 Constipation, unspecified: Secondary | ICD-10-CM

## 2023-08-30 DIAGNOSIS — R194 Change in bowel habit: Secondary | ICD-10-CM

## 2023-08-30 DIAGNOSIS — Z8371 Family history of adenomatous and serrated polyps: Secondary | ICD-10-CM

## 2023-08-30 DIAGNOSIS — K644 Residual hemorrhoidal skin tags: Secondary | ICD-10-CM | POA: Diagnosis not present

## 2023-08-30 LAB — COMPREHENSIVE METABOLIC PANEL WITH GFR
ALT: 16 U/L (ref 0–35)
AST: 14 U/L (ref 0–37)
Albumin: 4.5 g/dL (ref 3.5–5.2)
Alkaline Phosphatase: 105 U/L (ref 39–117)
BUN: 13 mg/dL (ref 6–23)
CO2: 29 meq/L (ref 19–32)
Calcium: 9.9 mg/dL (ref 8.4–10.5)
Chloride: 104 meq/L (ref 96–112)
Creatinine, Ser: 0.72 mg/dL (ref 0.40–1.20)
GFR: 81.5 mL/min (ref >60.00)
Glucose, Bld: 111 mg/dL — ABNORMAL HIGH (ref 70–99)
Potassium: 3.9 meq/L (ref 3.5–5.1)
Sodium: 141 meq/L (ref 135–145)
Total Bilirubin: 0.5 mg/dL (ref 0.2–1.2)
Total Protein: 7.7 g/dL (ref 6.0–8.3)

## 2023-08-30 LAB — CBC WITH DIFFERENTIAL/PLATELET
Basophils Absolute: 0.1 10*3/uL (ref 0.0–0.1)
Basophils Relative: 0.8 % (ref 0.0–3.0)
Eosinophils Absolute: 0.1 10*3/uL (ref 0.0–0.7)
Eosinophils Relative: 1.2 % (ref 0.0–5.0)
HCT: 40.4 % (ref 36.0–46.0)
Hemoglobin: 13.6 g/dL (ref 12.0–15.0)
Lymphocytes Relative: 24.7 % (ref 12.0–46.0)
Lymphs Abs: 1.7 10*3/uL (ref 0.7–4.0)
MCHC: 33.5 g/dL (ref 30.0–36.0)
MCV: 90.1 fl (ref 78.0–100.0)
Monocytes Absolute: 0.4 10*3/uL (ref 0.1–1.0)
Monocytes Relative: 6.3 % (ref 3.0–12.0)
Neutro Abs: 4.5 10*3/uL (ref 1.4–7.7)
Neutrophils Relative %: 67 % (ref 43.0–77.0)
Platelets: 240 10*3/uL (ref 150.0–400.0)
RBC: 4.48 Mil/uL (ref 3.87–5.11)
RDW: 13.9 % (ref 11.5–15.5)
WBC: 6.7 10*3/uL (ref 4.0–10.5)

## 2023-08-30 MED ORDER — NA SULFATE-K SULFATE-MG SULF 17.5-3.13-1.6 GM/177ML PO SOLN: 1.0000 | ORAL | 0 refills | Status: DC

## 2023-08-30 MED ORDER — HYDROCORTISONE ACETATE 25 MG RE SUPP: 25.0000 mg | Freq: Every day | RECTAL | 1 refills | Status: AC

## 2023-08-30 NOTE — Progress Notes (Signed)
 Brittney Smith [id number] [birth year]   Chief Complaint: Rectal pressure, change in bowel habits  Referring Provider: Nicholaus Bark* Primary GI MD: Para Bold  HPI: Brittney Smith is a 78 y.o. female, retired Engineer, civil (consulting), with past medical history of HTN, sleep apnea on CPAP, cholecystectomy 2023, diverticulitis, prior abdominal hysterectomy, family history of colon polyps and colon cancer with multiple second-degree relatives who presents today for a complaint of rectal pressure and change in bowel habits.    Per chart review, patient has a small airway listed under complication of anesthesia, documented difficult intubation.   Patient states she has noticed a change in bowel habits in the last couple of years.  Frequently passes small pieces of stool, states her rectum feels full or  half-full all the time.  She has a lot of straining with bowel movements, and states she sometimes has to apply pressure to her right buttock in order to pass stool. She typically moves her bowels daily but with increased effort.  She has history of hemorrhoids since having her first child and sometimes has associated rectal pain when hemorrhoids flareup.  She did notice some bright red bleeding after straining with a bowel movement about a month ago, this has since resolved.  At times she feels something protruding from her rectum which she thinks may be an internal hemorrhoid.  She has been trying to eat a high-fiber diet, and taking Colace as needed but problems have persisted.  She is due for repeat colonoscopy next year, but based on the change in her normal bowel habits and recent bleeding she was concerned and wanted to discuss having an earlier colonoscopy.  States she has not had a pelvic exam in many years.  She did recently examine herself and felt like there was a bulge in her vagina and is unsure if this could be a cystocele or rectocele.  She denies any urinary symptoms, no urinary incontinence.  She  does report feeling a lot of pelvic pressure.  Patient denies any watery diarrhea.  Denies any upper GI symptoms including dysphagia, acid reflux, heartburn, nausea, vomiting.  She does report having an episode of diverticulitis after eating a lot of nuts at Christmas 2 years ago.  This was treated with antibiotics.  She had similar symptoms of LUQ abdominal pain a month ago this resolved on its own.  She thinks she may have had 1 additional episode of diverticulitis previously.  Patient denies alcohol use.  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  States she has mitral valve regurgitation for which she sees a cardiologist annually.  Otherwise denies heart or lung problems.   Previous GI Procedures/Imaging   CT A/P 06/20/2021 1. Acute uncomplicated descending colon diverticulitis. 2. Cholelithiasis with common bile duct dilatation. Correlate clinically for cholecystitis. Consider further evaluation with ultrasound.  Colonoscopy 09/16/2019 ([facility]) - Diverticulosis in the sigmoid colon and in the descending colon. - The examination was otherwise normal on direct and retroflexion views. - No specimens collected. - Repeat colonoscopy in 5 years for surveillance.   Past Medical History:  Diagnosis Date   Complication of anesthesia    PATIENT HAS A SMALL AIRWAY   High cholesterol    Hypertension    PONV (postoperative nausea and vomiting)    Sleep apnea     Past Surgical History:  Procedure Laterality Date   ABDOMINAL HYSTERECTOMY     CHOLECYSTECTOMY N/A 09/07/2021  Procedure: LAPAROSCOPIC CHOLECYSTECTOMY WITH INTRAOPERATIVE CHOLANGIOGRAM;  Surgeon: Awilda Bogus, MD;  Location: [facility];  Service: General;  Laterality: N/A;   KNEE CARTILAGE SURGERY Left    ORIF ANKLE FRACTURE Left 2007    Current Outpatient Medications  Medication Sig Dispense Refill   aspirin EC 81 MG tablet Take 81 mg  by mouth at bedtime.     atorvastatin (LIPITOR) 40 MG tablet Take 40 mg by mouth at bedtime.     Cholecalciferol (VITAMIN D3) 50 MCG (2000 UT) CAPS Take 4,000 Units by mouth at bedtime.     docusate sodium (COLACE) 100 MG capsule Take 200 mg by mouth at bedtime.     escitalopram (LEXAPRO) 10 MG tablet Take 10 mg by mouth at bedtime.     losartan (COZAAR) 100 MG tablet Take 100 mg by mouth at bedtime.     Magnesium 250 MG TABS Take 250 mg by mouth at bedtime.     NON FORMULARY Pt uses a cpap nightly     No current facility-administered medications for this visit.    Allergies as of 08/30/2023   (No Known Allergies)    No family history on file.  Social History   Tobacco Use   Smoking status: Never   Smokeless tobacco: Never  Substance Use Topics   Alcohol use: Never   Drug use: Never     Review of Systems:    Constitutional: No weight loss, fever, chills, weakness or fatigue Skin: No rash or itching Cardiovascular: No chest pain, chest pressure or palpitations   Respiratory: No SOB or cough Gastrointestinal: See HPI and otherwise negative Genitourinary: No dysuria or change in urinary frequency Neurological: No headache, dizziness or syncope Musculoskeletal: No new muscle or joint pain Hematologic: No bruising    Physical Exam:  Vital signs: BP 138/70   Pulse 61   Ht 5' 5 (1.651 m)   Wt 164 lb (74.4 kg)   BMI 27.29 kg/m    Constitutional: NAD, Well developed, Well nourished, alert and cooperative Head:  Normocephalic and atraumatic.  Eyes: No scleral icterus. Conjunctiva pink. Mouth: No oral lesions. Respiratory: Respirations even and unlabored. Lungs clear to auscultation bilaterally.  No wheezes, crackles, or rhonchi.  Cardiovascular:  Regular rate and rhythm. No murmurs. No peripheral edema. Gastrointestinal:  Soft, nondistended, nontender. No rebound or guarding. Normal bowel sounds. No appreciable masses or hepatomegaly. Rectal: Non-bleeding,  non-thrombosed external hemorrhoid. No fissures. Firm stool in rectum which is brown and heme-negative. Possible internal hemorrhoids palpated on DRE. No visible bleeding. Normal rectal tone. Neurologic:  Alert and oriented x4;  grossly normal neurologically.  Skin:   Dry and intact without significant lesions or rashes. Psychiatric: Oriented to person, place and time. Demonstrates good judgement and reason without abnormal affect or behaviors.   RELEVANT LABS AND IMAGING: CBC    Component Value Date/Time   WBC 6.1 09/05/2021 1306   RBC 4.06 09/05/2021 1306   HGB 12.5 09/05/2021 1306   HCT 38.3 09/05/2021 1306   PLT 237 09/05/2021 1306   MCV 94.3 09/05/2021 1306   MCH 30.8 09/05/2021 1306   MCHC 32.6 09/05/2021 1306   RDW 13.2 09/05/2021 1306   LYMPHSABS 2.0 09/05/2021 1306   MONOABS 0.4 09/05/2021 1306   EOSABS 0.0 09/05/2021 1306   BASOSABS 0.0 09/05/2021 1306    CMP     Component Value Date/Time   NA 141 09/05/2021 1306   K 4.4 09/05/2021 1306   CL 108 09/05/2021 1306   CO2  28 09/05/2021 1306   GLUCOSE 122 (H) 09/05/2021 1306   BUN 12 09/05/2021 1306   CREATININE 0.76 09/05/2021 1306   CALCIUM 9.7 09/05/2021 1306   PROT 7.6 09/05/2021 1306   ALBUMIN 4.2 09/05/2021 1306   AST 17 09/05/2021 1306   ALT 18 09/05/2021 1306   ALKPHOS 88 09/05/2021 1306   BILITOT 0.5 09/05/2021 1306   GFRNONAA >60 09/05/2021 1306     Assessment/Plan:   Change in bowel habits Change in stool caliber Constipation  Hemorrhoids Rectal bleeding History of diverticulitis Family history of colon cancer and adenomatous colon polyps Patient with change in bowel habits in the last 2 years, passing small, thin stools with significant straining and sensation of incomplete evacuation.  She has increased dietary fiber and added Colace without improvement.  Had one episode of bleeding about a month ago which she attributes to straining with a bowel movement.   On exam she has a nonbleeding  external hemorrhoid, possible internal hemorrhoids, firm stool in rectum which is heme-negative. Due for colonoscopy next year based on family history of colon cancer and colon polyps (last done 2021).  Based on her change in bowel habits, rectal bleeding, and recent episode(s) of diverticulitis, we will go ahead and schedule this now.   - Schedule colonoscopy in hospital setting due to documented difficult intubation. I thoroughly discussed the procedure with the patient to include nature of the procedure, alternatives, benefits, and risks (including but not limited to bleeding, infection, perforation, anesthesia/cardiac/pulmonary complications). Patient verbalized understanding and gave verbal consent to proceed with procedure.  - Start fiber supplement - Start Miralax 1 capful daily, can adjust dose to response - Will send Anusol suppositories, 1 daily at bedtime for 7-10 days for treatment of hemorrhoids - Check labs: CBC, CMP - Advised patient to schedule visit with OB/GYN for pelvic exam   Valiant Gaul, PA-C [facility] 08/30/2023, 8:20 AM  Patient Care Team: Nicholaus Bark, MD as PCP - General (Internal Medicine)

## 2023-08-30 NOTE — Patient Instructions (Addendum)
 Your provider has requested that you go to the basement level for lab work before leaving today. Press B on the elevator. The lab is located at the first door on the left as you exit the elevator.  Due to recent changes in healthcare laws, you may see the results of your imaging and laboratory studies on MyChart before your provider has had a chance to review them.  We understand that in some cases there may be results that are confusing or concerning to you. Not all laboratory results come back in the same time frame and the provider may be waiting for multiple results in order to interpret others.  Please give us  48 hours in order for your provider to thoroughly review all the results before contacting the office for clarification of your results.  You have been scheduled for a colonoscopy. Please follow written instructions given to you at your visit today.   If you use inhalers (even only as needed), please bring them with you on the day of your procedure.  DO NOT TAKE 7 DAYS PRIOR TO TEST- Trulicity (dulaglutide) Ozempic, Wegovy (semaglutide) Mounjaro (tirzepatide) Bydureon Bcise (exanatide extended release)  DO NOT TAKE 1 DAY PRIOR TO YOUR TEST Rybelsus (semaglutide) Adlyxin (lixisenatide) Victoza (liraglutide) Byetta (exanatide) ___________________________________________________________________________     Start miralax 1 capful daily.  Start fiber supplement-metamucil or benefiber. Handout provided.  We have sent the following medications to your pharmacy for you to pick up at your convenience: Anusol suppositories  I appreciate the opportunity to care for you. Valiant Gaul, PA

## 2023-09-24 IMAGING — CT CT ABD-PELV W/ CM
2 of 5 series · 16 of 46 positions shown, 18 images · IV contrast (Omnipaque or Isovue)
Comparison: None.

CLINICAL DATA: Acute abdominal pain.

EXAM:
CT ABDOMEN AND PELVIS WITH CONTRAST
TECHNIQUE: Multidetector CT imaging of the abdomen and pelvis was performed
using the standard protocol following bolus administration of
intravenous contrast.

[Series 2: axial st · axial · 0.78mm/px · z∈[+944,+1384]mm · 13 of 102 slices shown, 15 images]
[im 7/102  soft-tissue]
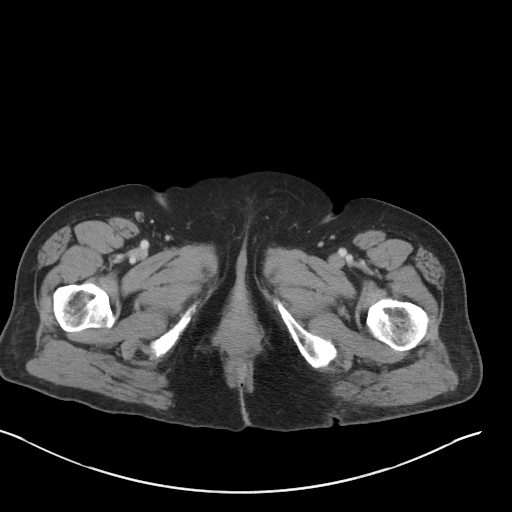
[im 7/102  bone]
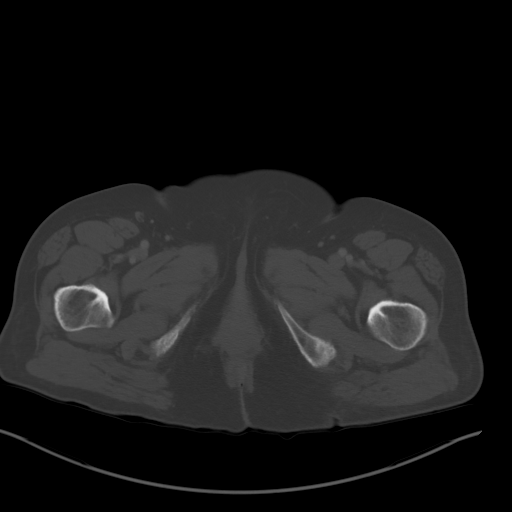
[im 13/102  soft-tissue]
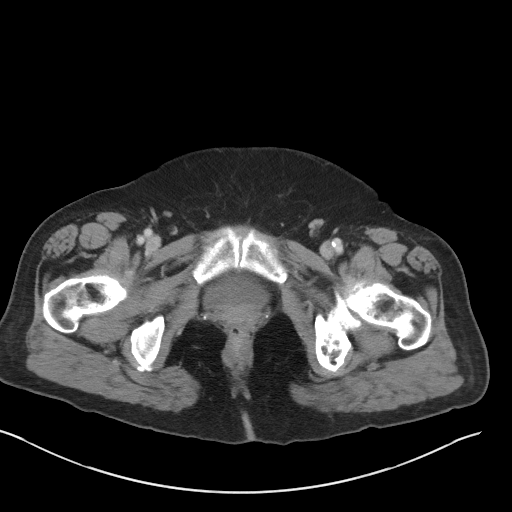
[im 19/102  soft-tissue]
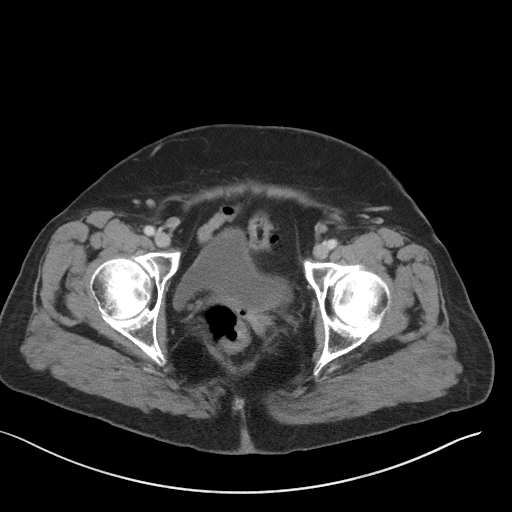
[im 32/102  soft-tissue]
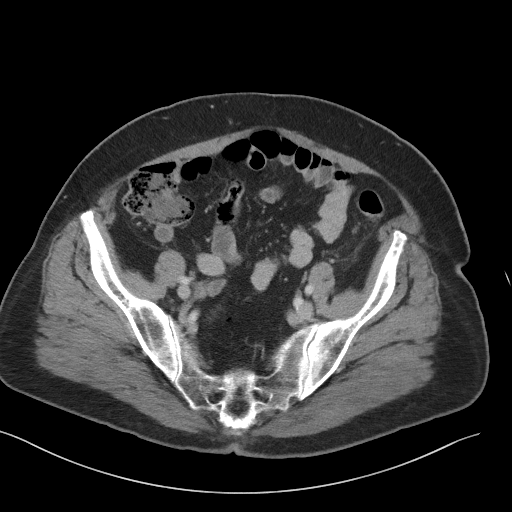
[im 38/102  soft-tissue]
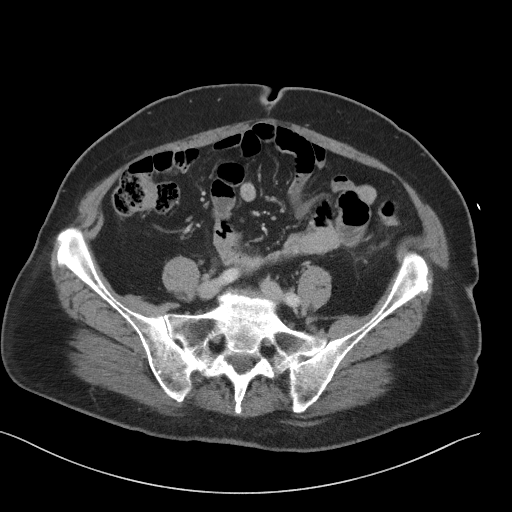
[im 45/102  soft-tissue]
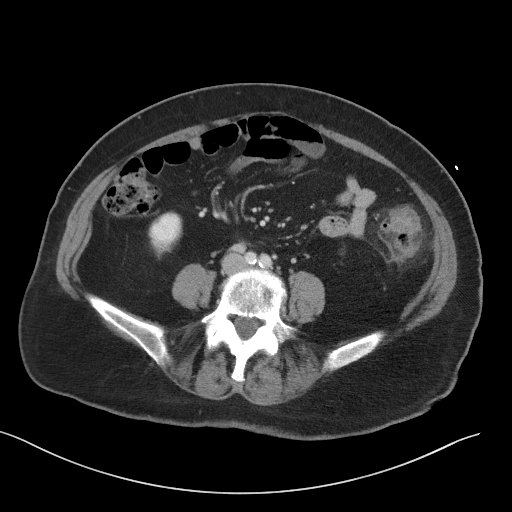
[im 51/102  soft-tissue]
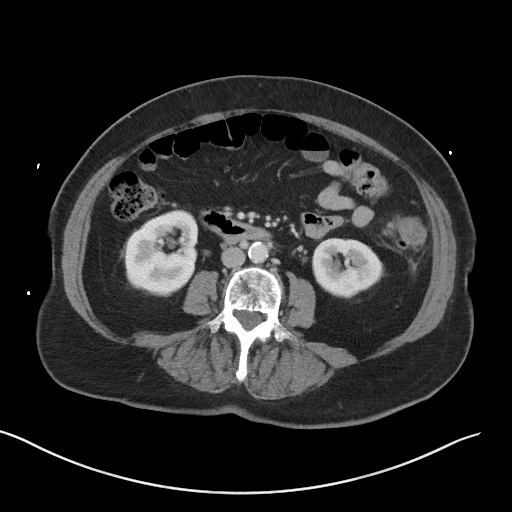
[im 57/102  soft-tissue]
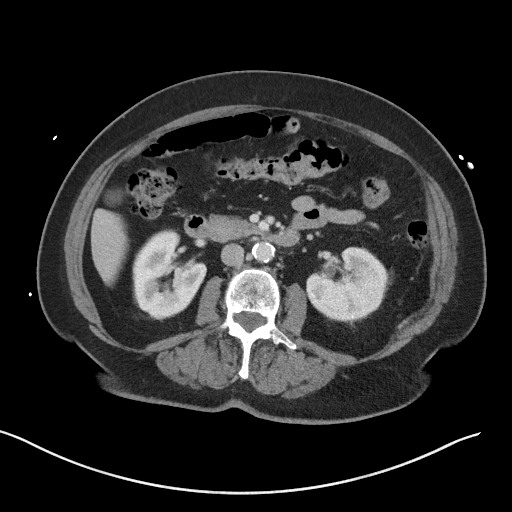
[im 64/102  soft-tissue]
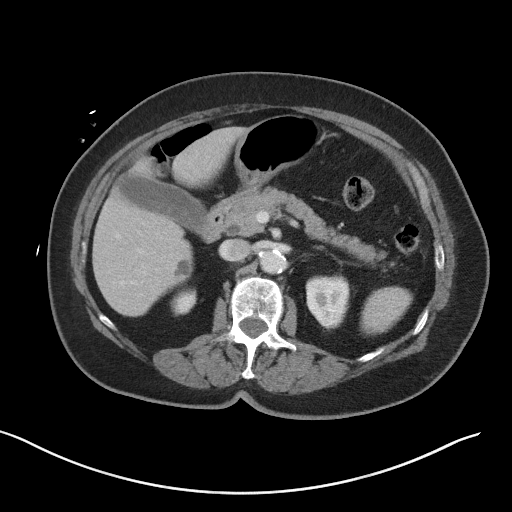
[im 64/102  bone]
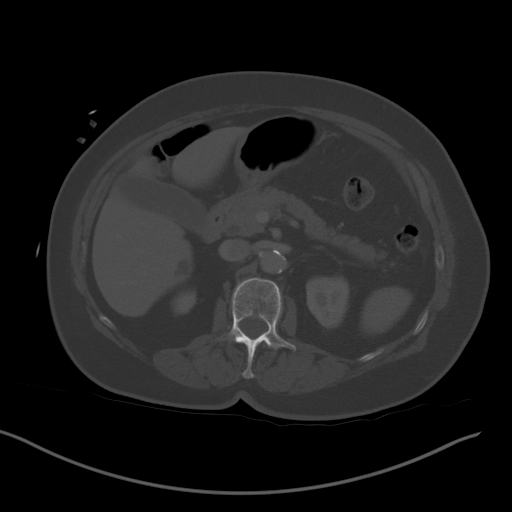
[im 70/102  soft-tissue]
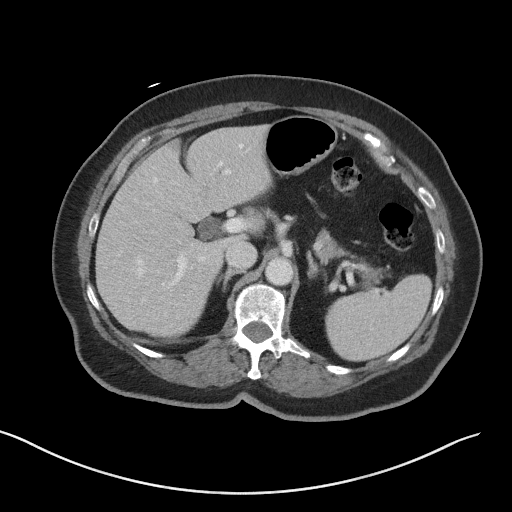
[im 83/102  soft-tissue]
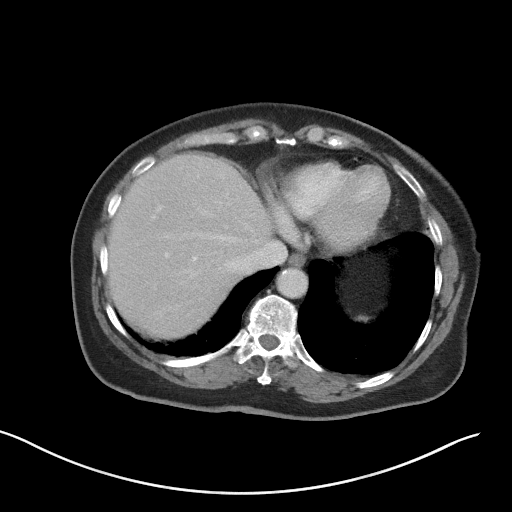
[im 89/102  soft-tissue]
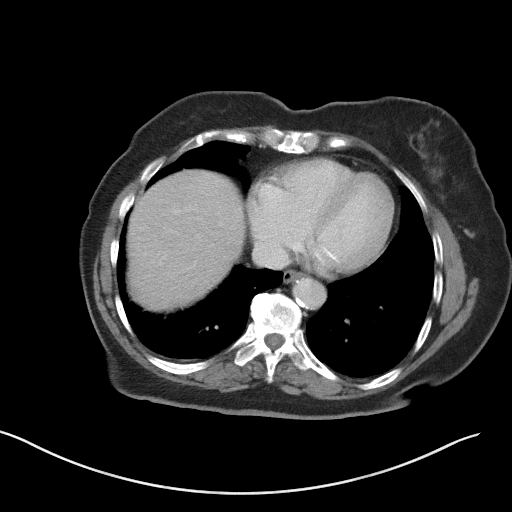
[im 95/102  soft-tissue]
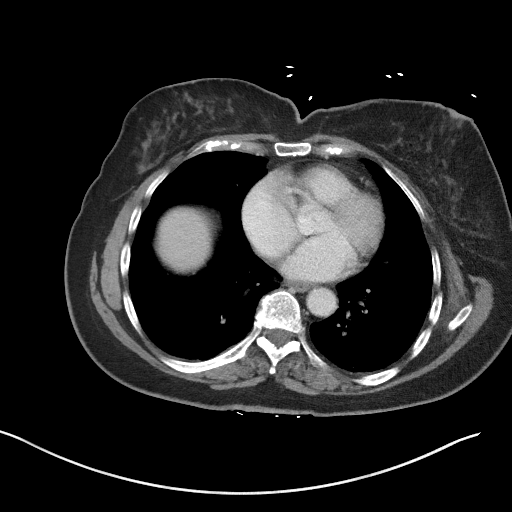

[Series 5: coronal st · coronal · 0.78mm/px · 3 of 103 slices shown]
[im 35/103  soft-tissue]
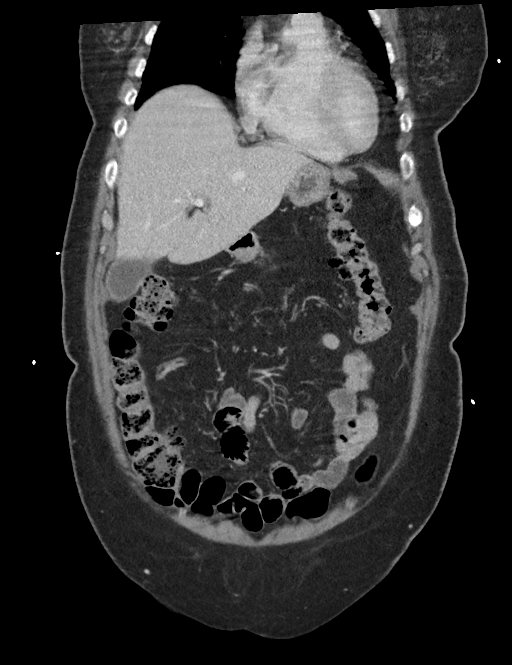
[im 46/103  soft-tissue]
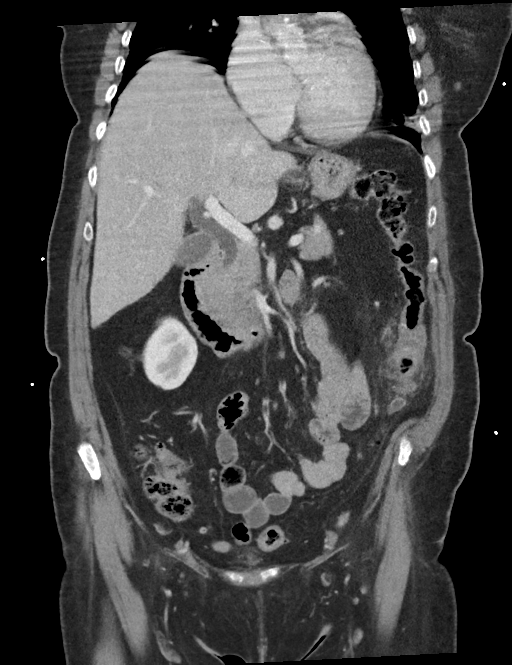
[im 57/103  soft-tissue]
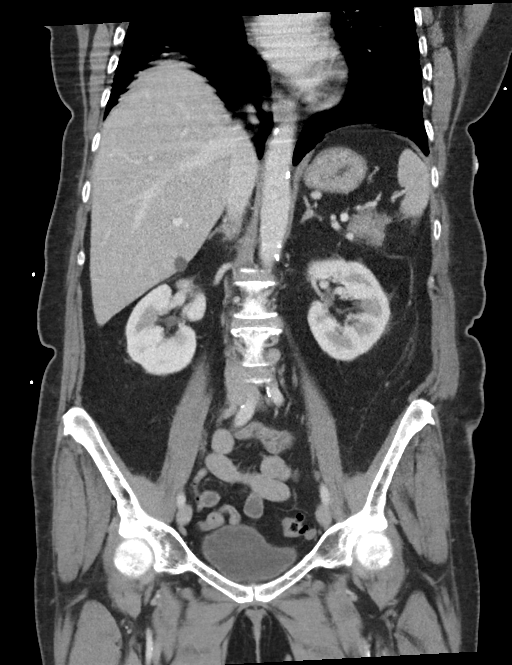

[16 of 46 positions shown; findings below may reference images not displayed]

RADIATION DOSE REDUCTION: This exam was performed according to the
departmental dose-optimization program which includes automated
exposure control, adjustment of the mA and/or kV according to
patient size and/or use of iterative reconstruction technique.

CONTRAST:  100mL OMNIPAQUE IOHEXOL 300 MG/ML  SOLN
FINDINGS: Lower chest: No acute abnormality.

Hepatobiliary: Gallstones are present. There is no pericholecystic
inflammation. Common bile duct is dilated measuring 11 mm. There is
some peripheral calcifications in the liver. There is also a liver
cyst measuring 14 mm. Liver is otherwise within normal limits.

Pancreas: Unremarkable. No pancreatic ductal dilatation or
surrounding inflammatory changes.

Spleen: Normal in size without focal abnormality.

Adrenals/Urinary Tract: Adrenal glands are unremarkable. Kidneys are
normal, without renal calculi, focal lesion, or hydronephrosis.
Bladder is unremarkable.

Stomach/Bowel: There is diffuse colonic diverticulosis. There is
wall thickening and inflammation involving the mid descending colon
compatible with acute diverticulitis. There is no evidence for
perforation or abscess. There is no bowel obstruction. The appendix
is within normal limits. Small bowel and stomach are within normal
limits.

Vascular/Lymphatic: Aortic atherosclerosis. No enlarged abdominal or
pelvic lymph nodes.

Reproductive: Status post hysterectomy. No adnexal masses.

Other: No abdominal wall hernia or abnormality. No abdominopelvic
ascites.

Musculoskeletal: No acute osseous findings. There are lucent lesions
with sclerotic borders in the left ischium favored as benign.
IMPRESSION: 1. Acute uncomplicated descending colon diverticulitis.
2. Cholelithiasis with common bile duct dilatation. Correlate
clinically for cholecystitis. Consider further evaluation with
ultrasound.

## 2023-09-26 ENCOUNTER — Encounter (HOSPITAL_COMMUNITY): Payer: Self-pay | Admitting: Internal Medicine

## 2023-09-27 NOTE — Progress Notes (Addendum)
 Brittney Smith    PCP-[place] MD in [facility] MD in [place] Pulmonologist- n/a   [id number] Echo-07/01/20 Cath-n/a Stress-n/a ICD/PM- n/a GLP1-n/a Blood Thinner- n/a  HX: Murmur, HTN, OSA, Small airway. She reports she sees primary in [place] and saw them in march, no new health issues. Also sees a cardiologist in [place] and has seen this year in the spring. She doesn't use any equipment, denies heart or breathing issues.  Anesthesia Review- Yes- due to cardiologist outside of system, anesthesia requesting copy of echo, GI office notified via epic chat 7/10. Update- anesthesia was able to access notes from echo in care everywhere and approves without needing copy of report.

## 2023-10-01 ENCOUNTER — Telehealth: Payer: Self-pay

## 2023-10-01 NOTE — Telephone Encounter (Signed)
 Procedure:Colonoscopy Procedure date: 10/02/23 Procedure location: [facility] Arrival Time: 9:45 Spoke with the patient Y/N: Y Any prep concerns? N  Has the patient obtained the prep from the pharmacy ? Y Do you have a care partner and transportation: Y Any additional concerns? N

## 2023-10-02 ENCOUNTER — Encounter (HOSPITAL_COMMUNITY)
Admission: RE | Disposition: A | Discharge status: Home / Self Care | Payer: Self-pay | Source: Home / Self Care | Attending: Internal Medicine

## 2023-10-02 ENCOUNTER — Other Ambulatory Visit: Payer: Self-pay

## 2023-10-02 ENCOUNTER — Ambulatory Visit (HOSPITAL_COMMUNITY)
Admission: RE | Admit: 2023-10-02 | Discharge: 2023-10-02 | Disposition: A | Discharge status: Home / Self Care | Attending: Internal Medicine | Admitting: Internal Medicine

## 2023-10-02 ENCOUNTER — Encounter (HOSPITAL_COMMUNITY): Payer: Self-pay | Admitting: Internal Medicine

## 2023-10-02 ENCOUNTER — Ambulatory Visit (HOSPITAL_COMMUNITY): Admitting: Anesthesiology

## 2023-10-02 DIAGNOSIS — Z9049 Acquired absence of other specified parts of digestive tract: Secondary | ICD-10-CM | POA: Diagnosis not present

## 2023-10-02 DIAGNOSIS — R194 Change in bowel habit: Secondary | ICD-10-CM | POA: Diagnosis not present

## 2023-10-02 DIAGNOSIS — N8110 Cystocele, unspecified: Secondary | ICD-10-CM | POA: Insufficient documentation

## 2023-10-02 DIAGNOSIS — I10 Essential (primary) hypertension: Secondary | ICD-10-CM

## 2023-10-02 DIAGNOSIS — F419 Anxiety disorder, unspecified: Secondary | ICD-10-CM | POA: Insufficient documentation

## 2023-10-02 DIAGNOSIS — Z800 Family history of malignant neoplasm of digestive organs: Secondary | ICD-10-CM | POA: Diagnosis not present

## 2023-10-02 DIAGNOSIS — I340 Nonrheumatic mitral (valve) insufficiency: Secondary | ICD-10-CM | POA: Diagnosis not present

## 2023-10-02 DIAGNOSIS — Z90710 Acquired absence of both cervix and uterus: Secondary | ICD-10-CM | POA: Diagnosis not present

## 2023-10-02 DIAGNOSIS — K648 Other hemorrhoids: Secondary | ICD-10-CM | POA: Diagnosis not present

## 2023-10-02 DIAGNOSIS — Z83719 Family history of colon polyps, unspecified: Secondary | ICD-10-CM

## 2023-10-02 DIAGNOSIS — G4733 Obstructive sleep apnea (adult) (pediatric): Secondary | ICD-10-CM

## 2023-10-02 DIAGNOSIS — G4730 Sleep apnea, unspecified: Secondary | ICD-10-CM | POA: Diagnosis not present

## 2023-10-02 DIAGNOSIS — N816 Rectocele: Secondary | ICD-10-CM | POA: Insufficient documentation

## 2023-10-02 DIAGNOSIS — K5730 Diverticulosis of large intestine without perforation or abscess without bleeding: Secondary | ICD-10-CM

## 2023-10-02 HISTORY — PX: COLONOSCOPY: SHX5424

## 2023-10-02 SURGERY — COLONOSCOPY
Anesthesia: Monitor Anesthesia Care

## 2023-10-02 MED ORDER — SODIUM CHLORIDE 0.9 % IV SOLN
INTRAVENOUS | Status: AC | PRN
  Administered 2023-10-02: 500 mL via INTRAMUSCULAR

## 2023-10-02 MED ORDER — PROPOFOL 500 MG/50ML IV EMUL
INTRAVENOUS | Status: DC | PRN
  Administered 2023-10-02: 125 ug/kg/min via INTRAVENOUS

## 2023-10-02 MED ORDER — LIDOCAINE 2% (20 MG/ML) 5 ML SYRINGE
INTRAMUSCULAR | Status: DC | PRN
  Administered 2023-10-02: 100 mg via INTRAVENOUS

## 2023-10-02 MED ORDER — PROPOFOL 10 MG/ML IV BOLUS
INTRAVENOUS | Status: DC | PRN
  Administered 2023-10-02: 50 mg via INTRAVENOUS

## 2023-10-02 NOTE — H&P (Signed)
  Gastroenterology History and Physical   Primary Care Physician:  Valere Ozell Shove, MD   Reason for Procedure:   Change in bowel habits + FHx CRCA/polyps + one episode rectal bleeding  Plan:    colonoscopy     HPI: Brittney Smith is a 78 y.o. female  retired Engineer, civil (consulting), with past medical history of HTN, sleep apnea on CPAP, cholecystectomy 2023, diverticulitis, prior abdominal hysterectomy, family history of colon polyps and colon cancer with multiple second-degree relatives (4 cousins) who presents today for a complaint of rectal pressure and change in bowel habits.     Per chart review, patient has a small airway listed under complication of anesthesia, documented difficult intubation.     Patient states she has noticed a change in bowel habits in the last couple of years.  Frequently passes small pieces of stool, states her rectum feels full or  half-full all the time.  She has a lot of straining with bowel movements, and states she sometimes has to apply pressure to her right buttock in order to pass stool. She typically moves her bowels daily but with increased effort.   She has history of hemorrhoids since having her first child and sometimes has associated rectal pain when hemorrhoids flareup.  She did notice some bright red bleeding after straining with a bowel movement about a month ago, this has since resolved.  At times she feels something protruding from her rectum which she thinks may be an internal hemorrhoid.   She has been trying to eat a high-fiber diet, and taking Colace as needed but problems have persisted.  She is due for repeat colonoscopy next year, but based on the change in her normal bowel habits and recent bleeding she was concerned and wanted to discuss having an earlier colonoscopy.   Patient denies any watery diarrhea.  Denies any upper GI symptoms including dysphagia, acid reflux, heartburn, nausea, vomiting.   States she has mitral valve regurgitation  for which she sees a cardiologist annually.  Otherwise denies heart or lung problems.  Saw GYN and has a cystocele. Since office visit in June she started MiraLax and symptoms are better.     Previous GI Procedures/Imaging    CT A/P 06/20/2021 1. Acute uncomplicated descending colon diverticulitis. 2. Cholelithiasis with common bile duct dilatation. Correlate clinically for cholecystitis. Consider further evaluation with ultrasound.   Colonoscopy 09/16/2019 ([facility]) - Diverticulosis in the sigmoid colon and in the descending colon. - The examination was otherwise normal on direct and retroflexion views. - No specimens collected. - Repeat colonoscopy in 5 years for surveillance.    Past Medical History:  Diagnosis Date   Anxiety    Complication of anesthesia    PATIENT HAS A SMALL AIRWAY   Diverticulosis    Gallstones    Heart murmur    High cholesterol    Hypertension    PONV (postoperative nausea and vomiting)    Sleep apnea    uses CPAP    Past Surgical History:  Procedure Laterality Date   ABDOMINAL HYSTERECTOMY  1985   partial   CHOLECYSTECTOMY N/A 09/07/2021   Procedure: LAPAROSCOPIC CHOLECYSTECTOMY WITH INTRAOPERATIVE CHOLANGIOGRAM;  Surgeon: Kallie Manuelita BROCKS, MD;  Location: [facility];  Service: General;  Laterality: N/A;   COLONOSCOPY     done 2021/2022 by Dr Gaile Peng at [facility] in [place]   KNEE CARTILAGE SURGERY Left    ORIF ANKLE FRACTURE Left 2007    Prior to Admission medications   Medication  Sig Start Date End Date Taking? Authorizing Provider  aspirin EC 81 MG tablet Take 81 mg by mouth at bedtime. 06/20/21  Yes [provider]  atorvastatin (LIPITOR) 40 MG tablet Take 40 mg by mouth at bedtime. 09/15/20  Yes [provider]  Cholecalciferol (VITAMIN D3) 50 MCG (2000 UT) CAPS Take 4,000 Units by mouth at bedtime.   Yes [provider]  docusate sodium (COLACE) 100 MG capsule Take 200 mg by mouth daily as needed for  mild constipation.   Yes [provider]  escitalopram (LEXAPRO) 10 MG tablet Take 10 mg by mouth at bedtime. 07/12/17  Yes [provider]  hydrocortisone  (ANUSOL -HC) 25 MG suppository Place 1 suppository (25 mg total) rectally at bedtime. Use for 7-10 days 08/30/23  Yes Heinz, Sara E, PA-C  losartan (COZAAR) 100 MG tablet Take 100 mg by mouth at bedtime. 07/12/17  Yes [provider]  Na Sulfate-K Sulfate-Mg Sulfate concentrate (SUPREP) 17.5-3.13-1.6 GM/177ML SOLN Take 1 kit (354 mLs total) by mouth as directed. 08/30/23  Yes Heinz, Camie BRAVO, PA-C  NON FORMULARY Pt uses a cpap nightly   Yes [provider]  Magnesium 400 MG CAPS Take 400 mg by mouth at bedtime.    [provider]    Current Facility-Administered Medications  Medication Dose Route Frequency Provider Last Rate Last Admin   0.9 %  sodium chloride  infusion    Continuous PRN Avram Lupita BRAVO, MD 20 mL/hr at 10/02/23 1055 500 mL at 10/02/23 1055    Allergies as of 08/30/2023   (No Known Allergies)    Family History  Problem Relation Age of Onset   Heart disease Mother    Irritable bowel syndrome Mother    Heart disease Father    Diverticulosis Father    Colon polyps Sister        pre-cancerous   Diverticulosis Sister    Prostate cancer Brother    Heart disease Brother    Diverticulosis Brother    Colon cancer Paternal Cousin        x 4 all Men   Esophageal cancer Neg Hx     Social History   Socioeconomic History   Marital status: Married    Spouse name: Not on file   Number of children: 1   Years of education: Not on file   Highest education level: Not on file  Occupational History   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  Tobacco Use   Smoking status: Never   Smokeless tobacco: Never  Vaping Use   Vaping status: Never Used  Substance and Sexual Activity   Alcohol use: Never   Drug use: Never   Sexual activity: Not on file  Other Topics Concern    Not on file  Social History Narrative   Not on file   Social Drivers of Health   Financial Resource Strain: Low Risk  (10/15/2020)   Received from [facility]   Overall Financial Resource Strain (CARDIA)    Difficulty of Paying Living Expenses: Not hard at all  Food Insecurity: No Food Insecurity (10/15/2020)   Received from [facility]   Hunger Vital Sign    Within the past 12 months, you worried that your food would run out before you got the money to buy more.: Never true    Within the past 12 months, the food you bought just didn't last and you didn't have money to get more.: Never true  Transportation Needs: No  Transportation Needs (10/15/2020)   Received from [facility]   PRAPARE - Transportation    Lack of Transportation (Medical): No    Lack of Transportation (Non-Medical): No  Physical Activity: Sufficiently Active (10/15/2020)   Received from [facility]   Exercise Vital Sign    On average, how many days per week do you engage in moderate to strenuous exercise (like a brisk walk)?: 6 days    On average, how many minutes do you engage in exercise at this level?: 120 min  Stress: No Stress Concern Present (10/15/2020)   Received from [facility] Occupational Health - Occupational Stress Questionnaire    Feeling of Stress : Not at all  Social Connections: Unknown (07/31/2021)   Received from [facility]   Social Network    Social Network: Not on file  Intimate Partner Violence: Unknown (06/22/2021)   Received from [facility]   HITS    Physically Hurt: Not on file    Insult or Talk Down To: Not on file    Threaten Physical Harm: Not on file    Scream or Curse: Not on file    Review of Systems:  All other review of systems negative except as mentioned in the HPI.  Physical Exam: Vital signs BP (!) 180/54   Pulse 63   Temp 98.3 F (36.8 C) (Temporal)   Resp 14   Ht 5' 5 (1.651 m)   Wt 73.9 kg   SpO2 96%   BMI 27.12 kg/m    General:   Alert,  Well-developed, well-nourished, pleasant and cooperative in NAD Lungs:  Clear throughout to auscultation.   Heart:  Regular rate and rhythm; 2/6 HSM. Abdomen:  Soft, nontender and nondistended. Normal bowel sounds.   Neuro/Psych:  Alert and cooperative. Normal mood and affect. A and O x 3   @Mor[place] Gipe  CHARLENA Commander, MD, [facility] Gastroenterology [phone] (pager) 10/02/2023 11:53 AM@

## 2023-10-02 NOTE — Anesthesia Preprocedure Evaluation (Signed)
 Anesthesia Evaluation  Patient identified by MRN, date of birth, ID band Patient awake    Reviewed: Allergy & Precautions, NPO status , Patient's Chart, lab work & pertinent test results  History of Anesthesia Complications (+) PONV and history of anesthetic complications  Airway Mallampati: II  TM Distance: >3 FB Neck ROM: Full    Dental  (+) Dental Advisory Given, Teeth Intact   Pulmonary sleep apnea and Continuous Positive Airway Pressure Ventilation    Pulmonary exam normal breath sounds clear to auscultation       Cardiovascular Exercise Tolerance: Good hypertension, Pt. on medications  Rhythm:Regular Rate:Normal + Systolic murmurs    Neuro/Psych   Anxiety     negative neurological ROS  negative psych ROS   GI/Hepatic negative GI ROS, Neg liver ROS,,,  Endo/Other  negative endocrine ROS    Renal/GU negative Renal ROS  negative genitourinary   Musculoskeletal negative musculoskeletal ROS (+)    Abdominal   Peds negative pediatric ROS (+)  Hematology negative hematology ROS (+)   Anesthesia Other Findings   Reproductive/Obstetrics negative OB ROS                              Anesthesia Physical Anesthesia Plan  ASA: 3  Anesthesia Plan: MAC   Post-op Pain Management: Minimal or no pain anticipated   Induction: Intravenous  PONV Risk Score and Plan: 3 and Propofol  infusion and Treatment may vary due to age or medical condition  Airway Management Planned: Simple Face Mask  Additional Equipment:   Intra-op Plan:   Post-operative Plan:   Informed Consent: I have reviewed the patients History and Physical, chart, labs and discussed the procedure including the risks, benefits and alternatives for the proposed anesthesia with the patient or authorized representative who has indicated his/her understanding and acceptance.     Dental advisory given  Plan Discussed with:  CRNA and Surgeon  Anesthesia Plan Comments:          Anesthesia Quick Evaluation

## 2023-10-02 NOTE — Op Note (Signed)
 [facility] Patient Name: Brittney Smith Procedure Date: 10/02/2023 MRN: [id number] Attending MD: Lupita FORBES Commander , MD, [id number] Date of Birth: October 23, 1945 CSN: [id number] Age: 78 Admit Type: Outpatient Procedure:                Colonoscopy Indications:              Change in bowel habits Providers:                Lupita CHARLENA Commander, MD, Randall Lines, RN, Haskel Chris, Technician Referring MD:              Medicines:                Monitored Anesthesia Care Complications:            No immediate complications. Estimated Blood Loss:     Estimated blood loss: none. Procedure:                Pre-Anesthesia Assessment:                           - Prior to the procedure, a History and Physical                            was performed, and patient medications and                            allergies were reviewed. The patient's tolerance of                            previous anesthesia was also reviewed. The risks                            and benefits of the procedure and the sedation                            options and risks were discussed with the patient.                            All questions were answered, and informed consent                            was obtained. Prior Anticoagulants: The patient has                            taken no anticoagulant or antiplatelet agents. ASA                            Grade Assessment: III - A patient with severe                            systemic disease. After reviewing the risks and  benefits, the patient was deemed in satisfactory                            condition to undergo the procedure.                           After obtaining informed consent, the colonoscope                            was passed under direct vision. Throughout the                            procedure, the patient's blood pressure, pulse, and                            oxygen saturations were  monitored continuously. The                            CF-HQ190L ([id number]) Olympus colonoscope was                            introduced through the anus and advanced to the the                            cecum, identified by appendiceal orifice and                            ileocecal valve. The colonoscopy was performed                            without difficulty. The patient tolerated the                            procedure well. The quality of the bowel                            preparation was adequate. The ileocecal valve,                            appendiceal orifice, and rectum were photographed.                            The bowel preparation used was SUPREP via split                            dose instruction. Scope In: 12:09:20 PM Scope Out: 12:22:04 PM Scope Withdrawal Time: 0 hours 8 minutes 9 seconds  Total Procedure Duration: 0 hours 12 minutes 44 seconds  Findings:      The digital rectal exam findings include rectocele. Pertinent negatives       include no palpable rectal lesions.      Multiple diverticula were found in the sigmoid colon.      Internal hemorrhoids were found.      The exam was otherwise without abnormality on direct and retroflexion  views. Impression:               - Rectocele found on digital rectal exam.                           - Diverticulosis in the sigmoid colon.                           - Internal hemorrhoids.                           - The examination was otherwise normal on direct                            and retroflexion views. She does have a family                            history of 4 first cousins w/ colon cancer                           - No specimens collected. Moderate Sedation:      Not Applicable - Patient had care per Anesthesia. Recommendation:           - Patient has a contact number available for                            emergencies. The signs and symptoms of potential                            delayed  complications were discussed with the                            patient. Return to normal activities tomorrow.                            Written discharge instructions were provided to the                            patient.                           - Resume previous diet.                           - Continue present medications.                           - No repeat colonoscopy due to age. Should she need                            repeat would do at hospital due to known difficult                            airway.                           -  She told me GYN found a cystocele also. Pelvic                            floor weakness likely cause of bowel sxs (which                            have improved on MiraLax) ? needs intervention,                            would consider pelvic floor PT at least. Will                            discuss. Procedure Code(s):        --- Professional ---                           [phone], Colonoscopy, flexible; diagnostic, including                            collection of specimen(s) by brushing or washing,                            when performed (separate procedure) Diagnosis Code(s):        --- Professional ---                           K64.8, Other hemorrhoids                           R19.4, Change in bowel habit                           K57.30, Diverticulosis of large intestine without                            perforation or abscess without bleeding CPT copyright 2022 American Medical Association. All rights reserved. The codes documented in this report are preliminary and upon coder review may  be revised to meet current compliance requirements. Lupita FORBES Commander, MD 10/02/2023 12:37:11 PM This report has been signed electronically. Number of Addenda: 0

## 2023-10-02 NOTE — Discharge Instructions (Signed)
 I did not find any polyps or cancer. Hemorrhoids and diverticulosis seen.  I do think you have a rectocele along with the cystocele your gynecologist found (maybe they also found that).  You should follow-up with gynecology - would consider pelvic floor PT at least if not other interventions (? Surgery).  No routine repeat colonoscopy needed.  I appreciate the opportunity to care for you. Lupita CHARLENA Commander, MD, FACG  YOU HAD AN ENDOSCOPIC PROCEDURE TODAY: Refer to the procedure report and other information in the discharge instructions given to you for any specific questions about what was found during the examination. If this information does not answer your questions, please call Dr. Darilyn office at [phone] to clarify.   YOU SHOULD EXPECT: Some feelings of bloating in the abdomen. Passage of more gas than usual. Walking can help get rid of the air that was put into your GI tract during the procedure and reduce the bloating. If you had a lower endoscopy (such as a colonoscopy or flexible sigmoidoscopy) you may notice spotting of blood in your stool or on the toilet paper. Some abdominal soreness may be present for a day or two, also.  DIET: Your first meal following the procedure should be a light meal and then it is ok to progress to your normal diet. A half-sandwich or bowl of soup is an example of a good first meal. Heavy or fried foods are harder to digest and may make you feel nauseous or bloated. Drink plenty of fluids but you should avoid alcoholic beverages for 24 hours.   ACTIVITY: Your care partner should take you home directly after the procedure. You should plan to take it easy, moving slowly for the rest of the day. You can resume normal activity the day after the procedure however YOU SHOULD NOT DRIVE, use power tools, machinery or perform tasks that involve climbing or major physical exertion for 24 hours (because of the sedation medicines used during the test).   SYMPTOMS  TO REPORT IMMEDIATELY: A gastroenterologist can be reached at any hour. Please call [phone]  for any of the following symptoms:  Following lower endoscopy (colonoscopy, flexible sigmoidoscopy) Excessive amounts of blood in the stool  Significant tenderness, worsening of abdominal pains  Swelling of the abdomen that is new, acute  Fever of 100 or higher  Following upper endoscopy (EGD, EUS, ERCP, esophageal dilation) Vomiting of blood or coffee ground material  New, significant abdominal pain  New, significant chest pain or pain under the shoulder blades  Painful or persistently difficult swallowing  New shortness of breath  Black, tarry-looking or red, bloody stools  FOLLOW UP:  If any biopsies were taken you will be contacted by phone or by letter within the next 1-3 weeks. Call [phone]  if you have not heard about the biopsies in 3 weeks.  Please also call with any specific questions about appointments or follow up tests.

## 2023-10-02 NOTE — Anesthesia Postprocedure Evaluation (Signed)
 Anesthesia Post Note  Patient: Brittney Smith  Procedure(s) Performed: COLONOSCOPY     Patient location during evaluation: PACU Anesthesia Type: MAC Level of consciousness: awake and alert Pain management: pain level controlled Vital Signs Assessment: post-procedure vital signs reviewed and stable Respiratory status: spontaneous breathing, nonlabored ventilation and respiratory function stable Cardiovascular status: blood pressure returned to baseline and stable Postop Assessment: no apparent nausea or vomiting Anesthetic complications: no   No notable events documented.  Last Vitals:  Vitals:   10/02/23 1230 10/02/23 1240  BP: (!) 121/54 (!) 150/60  Pulse: 71 64  Resp: 19   Temp:    SpO2: 96%     Last Pain:  Vitals:   10/02/23 1240  TempSrc:   PainSc: 0-No pain                 Butler Levander Pinal

## 2023-10-02 NOTE — Transfer of Care (Signed)
 Immediate Anesthesia Transfer of Care Note  Patient: Brittney Smith  Procedure(s) Performed: COLONOSCOPY  Patient Location: Endoscopy Unit  Anesthesia Type:MAC  Level of Consciousness: drowsy and patient cooperative  Airway & Oxygen Therapy: Patient Spontanous Breathing  Post-op Assessment: Report given to RN and Post -op Vital signs reviewed and stable  Post vital signs: Reviewed and stable  Last Vitals:  Vitals Value Taken Time  BP    Temp 35.8 C 10/02/23 12:26  Pulse 66 10/02/23 12:26  Resp 11 10/02/23 12:26  SpO2      Last Pain:  Vitals:   10/02/23 1226  TempSrc: Temporal  PainSc: Asleep         Complications: No notable events documented.

## 2023-10-04 ENCOUNTER — Encounter (HOSPITAL_COMMUNITY): Payer: Self-pay | Admitting: Internal Medicine

## 2023-12-12 IMAGING — RF DG CHOLANGIOGRAM OPERATIVE
1 series · 14 of 24 positions shown · non-contrast
Comparison: CT 06/20/2021

CLINICAL DATA: 75-year-old female with cholelithiasis

EXAM:
INTRAOPERATIVE CHOLANGIOGRAM
TECHNIQUE: Cholangiographic images from the C-arm fluoroscopic device were
submitted for interpretation post-operatively. Please see the
procedural report for the amount of contrast and the fluoroscopy
time utilized.
FLUOROSCOPY:
Radiation Exposure Index (as provided by the fluoroscopic device):
41 mGy Kerma

[Series 1: run · 13 acquisitions, 14 frames shown]
[im 1/13]
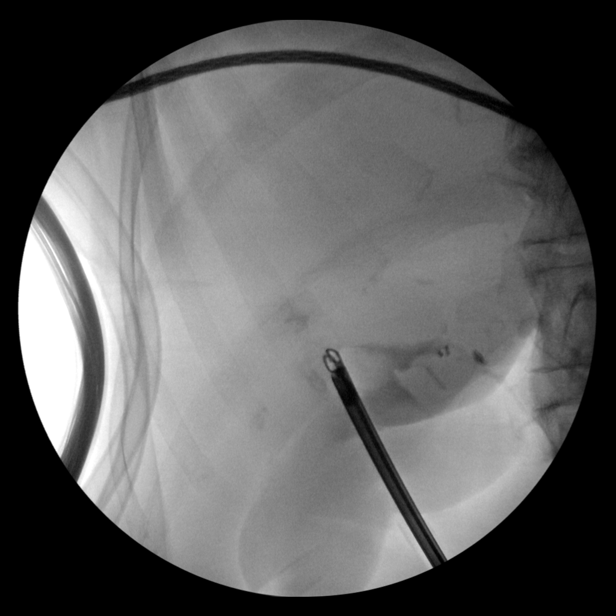
[im 2/13]
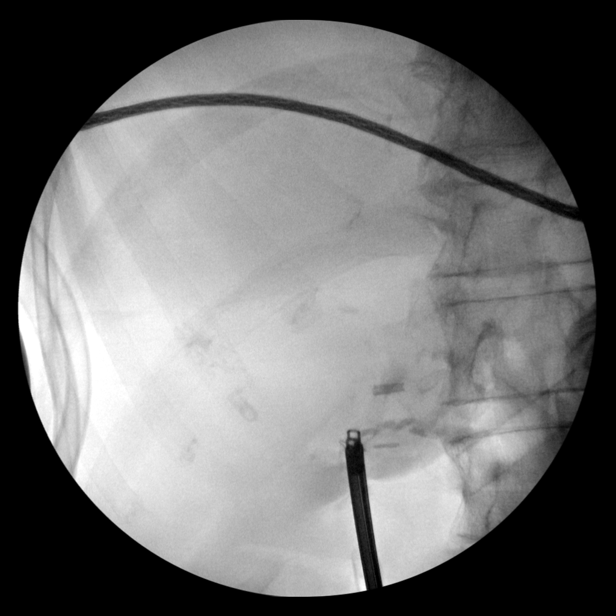
[im 2/13]
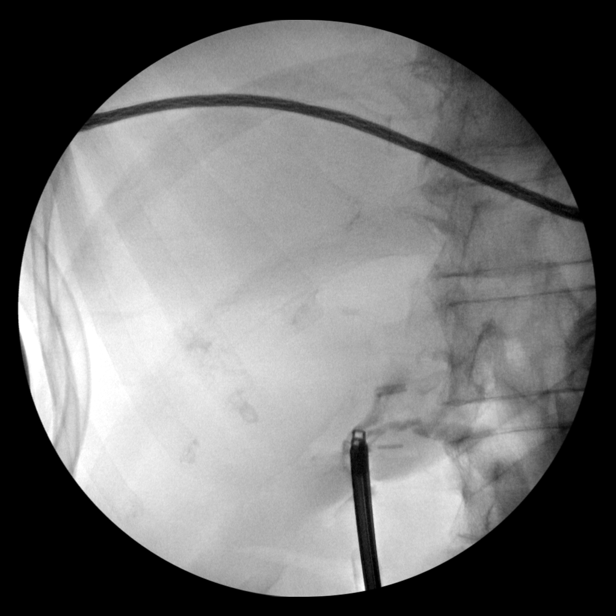
[im 3/13]
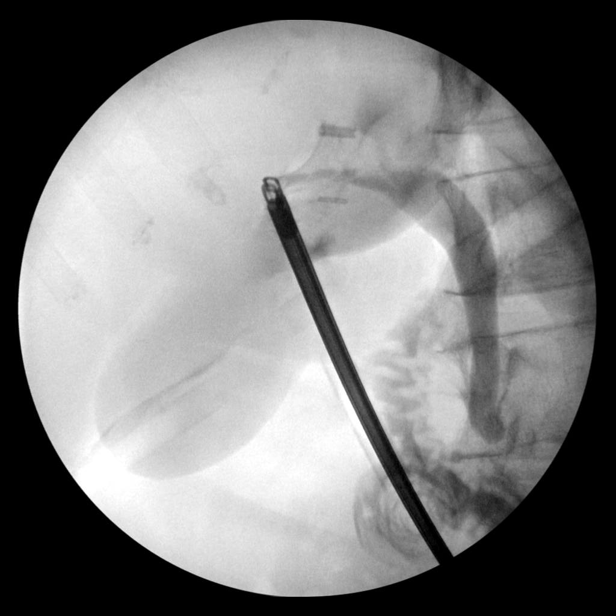
[im 3/13]
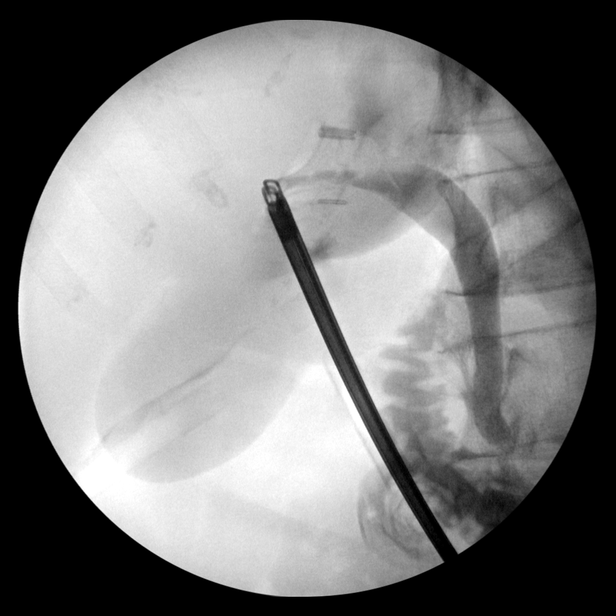
[im 5/13]
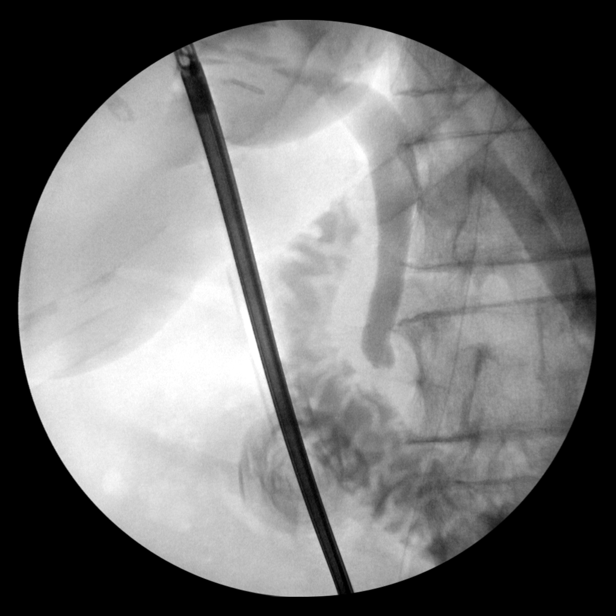
[im 6/13]
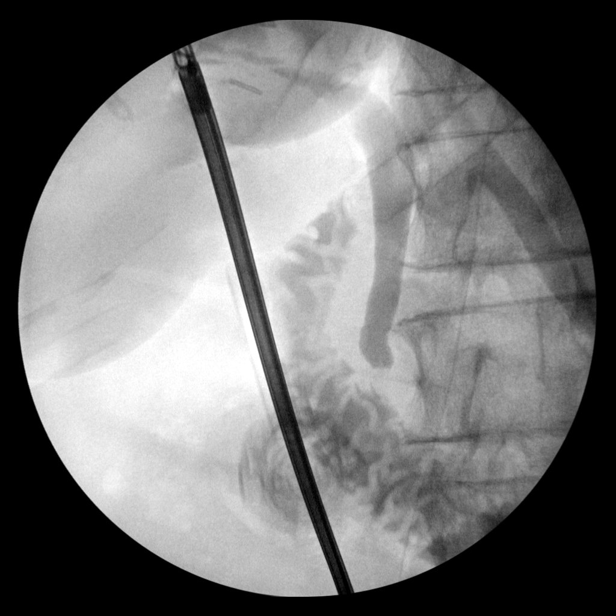
[im 7/13]
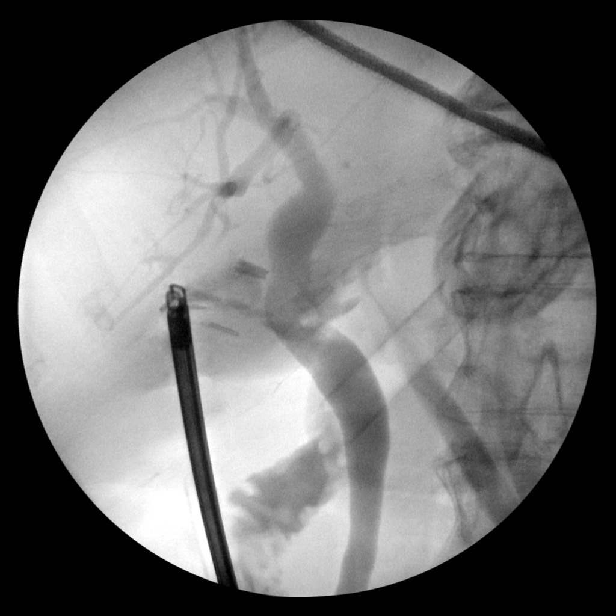
[im 8/13]
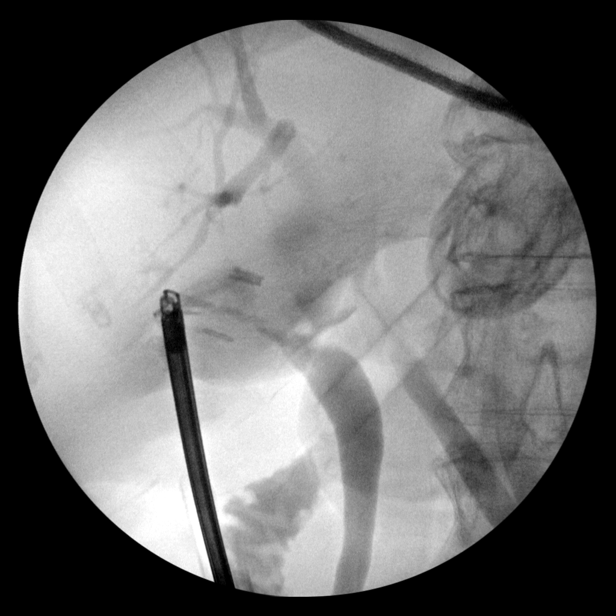
[im 9/13]
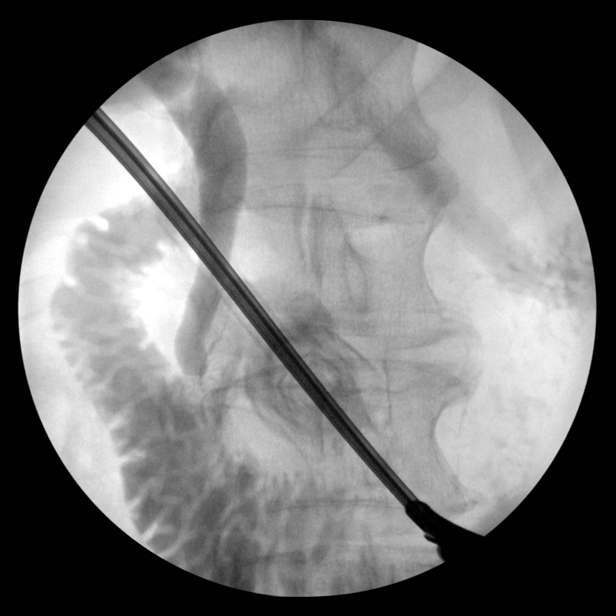
[im 11/13]
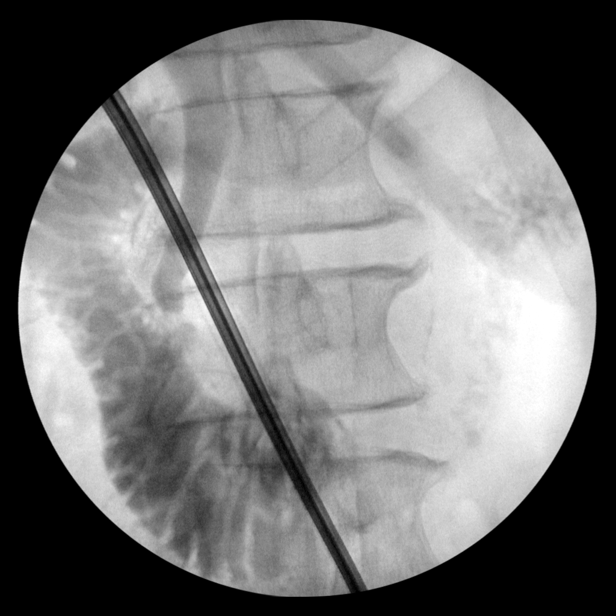
[im 11/13]
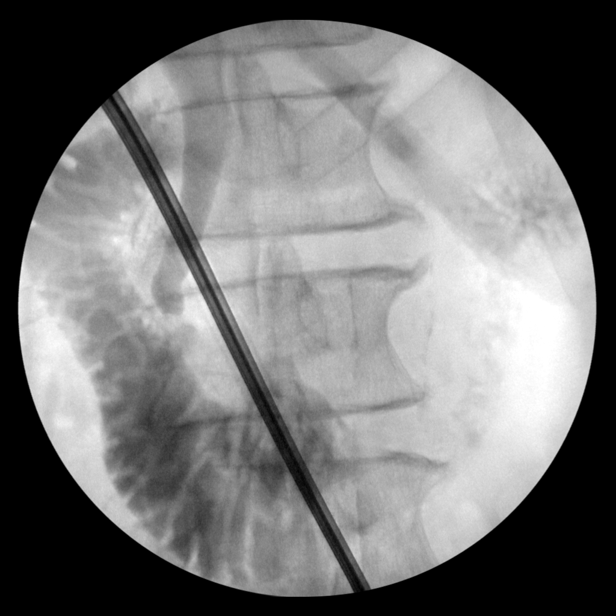
[im 12/13]
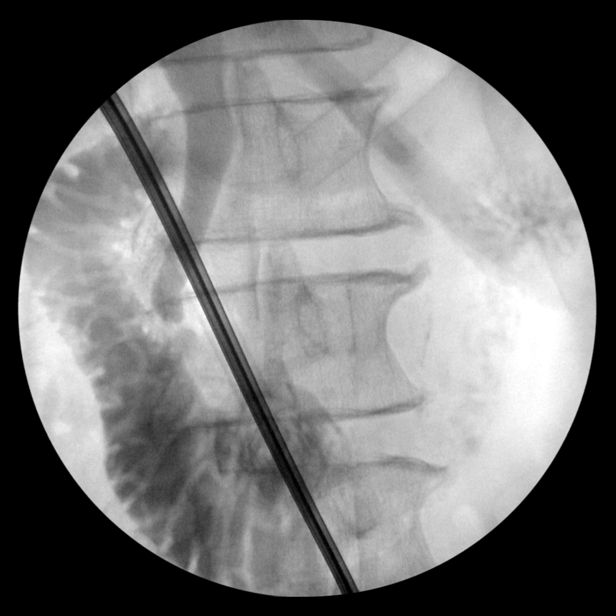
[im 13/13]
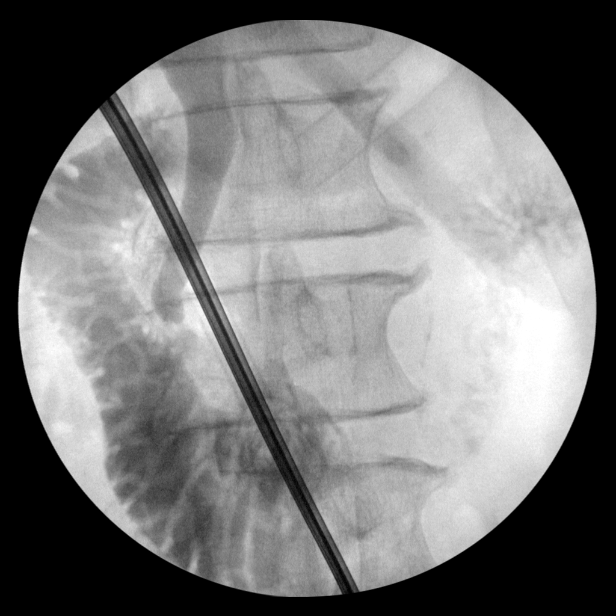

[14 of 24 positions shown; findings below may reference images not displayed]

FINDINGS: Surgical instruments project over the upper abdomen.

There is cannulation of the cystic duct/gallbladder neck, with
antegrade infusion of contrast. Caliber of the extrahepatic ductal
system within normal limits.

No definite filling defect within the extrahepatic ducts identified.

Ill-defined contrast extending cephalad from common hepatic duct,
uncertain location.

Free flow of contrast across the ampulla.
IMPRESSION: Intraoperative cholangiogram demonstrates extrahepatic biliary ducts
of unremarkable caliber, with no definite filling defects
identified. Free flow of contrast across the ampulla.

Contrast extends cephalad from the common hepatic duct in uncertain
location, potentially representing a leak.

Please refer to the dictated operative report for full details of
intraoperative findings and procedure
